# Patient Record
Sex: Female | Born: 1940 | Race: Black or African American | Hispanic: No | Smoking: Former smoker
Health system: Southern US, Community
[De-identification: ages and names within clinical notes are randomized; demographics above are authoritative.]

## PROBLEM LIST (undated history)

## (undated) DIAGNOSIS — E039 Hypothyroidism, unspecified: Secondary | ICD-10-CM

## (undated) DIAGNOSIS — E119 Type 2 diabetes mellitus without complications: Secondary | ICD-10-CM

## (undated) DIAGNOSIS — I10 Essential (primary) hypertension: Secondary | ICD-10-CM

## (undated) DIAGNOSIS — N189 Chronic kidney disease, unspecified: Secondary | ICD-10-CM

## (undated) DIAGNOSIS — D649 Anemia, unspecified: Secondary | ICD-10-CM

## (undated) DIAGNOSIS — K219 Gastro-esophageal reflux disease without esophagitis: Secondary | ICD-10-CM

## (undated) DIAGNOSIS — I639 Cerebral infarction, unspecified: Secondary | ICD-10-CM

## (undated) HISTORY — PX: BACK SURGERY: SHX140

## (undated) HISTORY — PX: CHOLECYSTECTOMY: SHX55

## (undated) HISTORY — PX: BREAST SURGERY: SHX581

## (undated) HISTORY — PX: VASCULAR SURGERY: SHX849

## (undated) HISTORY — PX: APPENDECTOMY: SHX54

## (undated) HISTORY — PX: KNEE ARTHROSCOPY WITH PATELLAR TENDON REPAIR: SHX5656

---

## 2013-11-28 ENCOUNTER — Ambulatory Visit: Payer: Self-pay | Admitting: Vascular Surgery

## 2013-11-28 LAB — CBC WITH DIFFERENTIAL/PLATELET
Basophil #: 0.1 10*3/uL (ref 0.0–0.1)
Basophil %: 1.3 %
EOS ABS: 0.5 10*3/uL (ref 0.0–0.7)
Eosinophil %: 6.5 %
HCT: 36.6 % (ref 35.0–47.0)
HGB: 11.7 g/dL — ABNORMAL LOW (ref 12.0–16.0)
LYMPHS ABS: 1.7 10*3/uL (ref 1.0–3.6)
Lymphocyte %: 22.1 %
MCH: 35.7 pg — ABNORMAL HIGH (ref 26.0–34.0)
MCHC: 32 g/dL (ref 32.0–36.0)
MCV: 112 fL — AB (ref 80–100)
MONOS PCT: 9.1 %
Monocyte #: 0.7 x10 3/mm (ref 0.2–0.9)
NEUTROS ABS: 4.7 10*3/uL (ref 1.4–6.5)
Neutrophil %: 61 %
PLATELETS: 94 10*3/uL — AB (ref 150–440)
RBC: 3.28 10*6/uL — ABNORMAL LOW (ref 3.80–5.20)
RDW: 15 % — ABNORMAL HIGH (ref 11.5–14.5)
WBC: 7.7 10*3/uL (ref 3.6–11.0)

## 2013-11-28 LAB — BASIC METABOLIC PANEL
Anion Gap: 11 (ref 7–16)
BUN: 62 mg/dL — ABNORMAL HIGH (ref 7–18)
CHLORIDE: 105 mmol/L (ref 98–107)
Calcium, Total: 8.7 mg/dL (ref 8.5–10.1)
Co2: 25 mmol/L (ref 21–32)
Creatinine: 8.95 mg/dL — ABNORMAL HIGH (ref 0.60–1.30)
EGFR (African American): 5 — ABNORMAL LOW
EGFR (Non-African Amer.): 4 — ABNORMAL LOW
Glucose: 101 mg/dL — ABNORMAL HIGH (ref 65–99)
OSMOLALITY: 299 (ref 275–301)
Potassium: 4.6 mmol/L (ref 3.5–5.1)
Sodium: 141 mmol/L (ref 136–145)

## 2014-01-22 ENCOUNTER — Ambulatory Visit: Payer: Self-pay | Admitting: Vascular Surgery

## 2014-01-22 LAB — BASIC METABOLIC PANEL
ANION GAP: 6 — AB (ref 7–16)
BUN: 42 mg/dL — AB (ref 7–18)
CHLORIDE: 103 mmol/L (ref 98–107)
CREATININE: 5.15 mg/dL — AB (ref 0.60–1.30)
Calcium, Total: 9.2 mg/dL (ref 8.5–10.1)
Co2: 31 mmol/L (ref 21–32)
EGFR (African American): 9 — ABNORMAL LOW
GFR CALC NON AF AMER: 8 — AB
Glucose: 97 mg/dL (ref 65–99)
Osmolality: 290 (ref 275–301)
POTASSIUM: 4.5 mmol/L (ref 3.5–5.1)
SODIUM: 140 mmol/L (ref 136–145)

## 2014-02-26 ENCOUNTER — Ambulatory Visit: Payer: Self-pay | Admitting: Vascular Surgery

## 2014-02-26 LAB — CBC
HCT: 36.7 % (ref 35.0–47.0)
HGB: 11.5 g/dL — AB (ref 12.0–16.0)
MCH: 35 pg — ABNORMAL HIGH (ref 26.0–34.0)
MCHC: 31.3 g/dL — ABNORMAL LOW (ref 32.0–36.0)
MCV: 112 fL — ABNORMAL HIGH (ref 80–100)
PLATELETS: 113 10*3/uL — AB (ref 150–440)
RBC: 3.29 10*6/uL — AB (ref 3.80–5.20)
RDW: 18.6 % — AB (ref 11.5–14.5)
WBC: 9.6 10*3/uL (ref 3.6–11.0)

## 2014-02-26 LAB — BASIC METABOLIC PANEL
Anion Gap: 10 (ref 7–16)
BUN: 51 mg/dL — ABNORMAL HIGH (ref 7–18)
CREATININE: 6.67 mg/dL — AB (ref 0.60–1.30)
Calcium, Total: 9.3 mg/dL (ref 8.5–10.1)
Chloride: 101 mmol/L (ref 98–107)
Co2: 28 mmol/L (ref 21–32)
EGFR (African American): 7 — ABNORMAL LOW
EGFR (Non-African Amer.): 6 — ABNORMAL LOW
GLUCOSE: 123 mg/dL — AB (ref 65–99)
OSMOLALITY: 293 (ref 275–301)
Potassium: 5.1 mmol/L (ref 3.5–5.1)
SODIUM: 139 mmol/L (ref 136–145)

## 2014-02-26 LAB — APTT: ACTIVATED PTT: 28.1 s (ref 23.6–35.9)

## 2014-02-26 LAB — PROTIME-INR
INR: 1.1
Prothrombin Time: 13.8 secs (ref 11.5–14.7)

## 2014-03-01 ENCOUNTER — Ambulatory Visit: Payer: Self-pay | Admitting: Vascular Surgery

## 2014-03-26 ENCOUNTER — Ambulatory Visit: Payer: Self-pay | Admitting: Vascular Surgery

## 2014-04-12 ENCOUNTER — Ambulatory Visit: Payer: Self-pay | Admitting: Vascular Surgery

## 2014-04-12 LAB — BASIC METABOLIC PANEL
Anion Gap: 10 (ref 7–16)
BUN: 52 mg/dL — ABNORMAL HIGH (ref 7–18)
CALCIUM: 9.2 mg/dL (ref 8.5–10.1)
Chloride: 100 mmol/L (ref 98–107)
Co2: 28 mmol/L (ref 21–32)
Creatinine: 7.33 mg/dL — ABNORMAL HIGH (ref 0.60–1.30)
EGFR (African American): 7 — ABNORMAL LOW
EGFR (Non-African Amer.): 6 — ABNORMAL LOW
Glucose: 89 mg/dL (ref 65–99)
OSMOLALITY: 289 (ref 275–301)
POTASSIUM: 5 mmol/L (ref 3.5–5.1)
Sodium: 138 mmol/L (ref 136–145)

## 2014-04-12 LAB — CBC
HCT: 38.4 % (ref 35.0–47.0)
HGB: 11.9 g/dL — AB (ref 12.0–16.0)
MCH: 33.5 pg (ref 26.0–34.0)
MCHC: 31 g/dL — ABNORMAL LOW (ref 32.0–36.0)
MCV: 108 fL — ABNORMAL HIGH (ref 80–100)
PLATELETS: 124 10*3/uL — AB (ref 150–440)
RBC: 3.55 10*6/uL — ABNORMAL LOW (ref 3.80–5.20)
RDW: 15.3 % — ABNORMAL HIGH (ref 11.5–14.5)
WBC: 7.2 10*3/uL (ref 3.6–11.0)

## 2014-04-12 LAB — PROTIME-INR
INR: 1
Prothrombin Time: 13.1 secs (ref 11.5–14.7)

## 2014-04-13 LAB — PHOSPHORUS: Phosphorus: 4 mg/dL (ref 2.5–4.9)

## 2014-08-18 ENCOUNTER — Observation Stay: Payer: Self-pay | Admitting: Vascular Surgery

## 2014-08-18 LAB — CBC WITH DIFFERENTIAL/PLATELET
Basophil #: 0.1 10*3/uL (ref 0.0–0.1)
Basophil %: 0.9 %
Eosinophil #: 0.2 10*3/uL (ref 0.0–0.7)
Eosinophil %: 2.6 %
HCT: 29.4 % — AB (ref 35.0–47.0)
HGB: 9.4 g/dL — ABNORMAL LOW (ref 12.0–16.0)
Lymphocyte #: 2.1 10*3/uL (ref 1.0–3.6)
Lymphocyte %: 22.3 %
MCH: 34.5 pg — ABNORMAL HIGH (ref 26.0–34.0)
MCHC: 32.2 g/dL (ref 32.0–36.0)
MCV: 107 fL — ABNORMAL HIGH (ref 80–100)
Monocyte #: 1 x10 3/mm — ABNORMAL HIGH (ref 0.2–0.9)
Monocyte %: 10.4 %
Neutrophil #: 6 10*3/uL (ref 1.4–6.5)
Neutrophil %: 63.8 %
Platelet: 105 10*3/uL — ABNORMAL LOW (ref 150–440)
RBC: 2.74 10*6/uL — ABNORMAL LOW (ref 3.80–5.20)
RDW: 17.4 % — AB (ref 11.5–14.5)
WBC: 9.4 10*3/uL (ref 3.6–11.0)

## 2014-08-18 LAB — BASIC METABOLIC PANEL
Anion Gap: 13 (ref 7–16)
BUN: 53 mg/dL — ABNORMAL HIGH (ref 7–18)
CALCIUM: 7.9 mg/dL — AB (ref 8.5–10.1)
CREATININE: 7.07 mg/dL — AB (ref 0.60–1.30)
Chloride: 105 mmol/L (ref 98–107)
Co2: 25 mmol/L (ref 21–32)
EGFR (Non-African Amer.): 6 — ABNORMAL LOW
GFR CALC AF AMER: 7 — AB
Glucose: 79 mg/dL (ref 65–99)
Osmolality: 298 (ref 275–301)
POTASSIUM: 5.4 mmol/L — AB (ref 3.5–5.1)
SODIUM: 143 mmol/L (ref 136–145)

## 2014-08-19 LAB — CBC WITH DIFFERENTIAL/PLATELET
BASOS PCT: 0.7 %
Basophil #: 0.1 10*3/uL (ref 0.0–0.1)
EOS ABS: 0.3 10*3/uL (ref 0.0–0.7)
Eosinophil %: 4.1 %
HCT: 25.4 % — AB (ref 35.0–47.0)
HGB: 8 g/dL — AB (ref 12.0–16.0)
LYMPHS PCT: 26.2 %
Lymphocyte #: 1.9 10*3/uL (ref 1.0–3.6)
MCH: 33.4 pg (ref 26.0–34.0)
MCHC: 31.5 g/dL — AB (ref 32.0–36.0)
MCV: 106 fL — ABNORMAL HIGH (ref 80–100)
MONO ABS: 0.8 x10 3/mm (ref 0.2–0.9)
Monocyte %: 10.7 %
Neutrophil #: 4.2 10*3/uL (ref 1.4–6.5)
Neutrophil %: 58.3 %
Platelet: 103 10*3/uL — ABNORMAL LOW (ref 150–440)
RBC: 2.4 10*6/uL — ABNORMAL LOW (ref 3.80–5.20)
RDW: 17 % — ABNORMAL HIGH (ref 11.5–14.5)
WBC: 7.2 10*3/uL (ref 3.6–11.0)

## 2014-08-19 LAB — BASIC METABOLIC PANEL
Anion Gap: 9 (ref 7–16)
BUN: 72 mg/dL — AB (ref 7–18)
Calcium, Total: 7.6 mg/dL — ABNORMAL LOW (ref 8.5–10.1)
Chloride: 100 mmol/L (ref 98–107)
Co2: 32 mmol/L (ref 21–32)
Creatinine: 7.86 mg/dL — ABNORMAL HIGH (ref 0.60–1.30)
EGFR (African American): 6 — ABNORMAL LOW
EGFR (Non-African Amer.): 5 — ABNORMAL LOW
Glucose: 84 mg/dL (ref 65–99)
Osmolality: 302 (ref 275–301)
POTASSIUM: 5.1 mmol/L (ref 3.5–5.1)
SODIUM: 141 mmol/L (ref 136–145)

## 2014-08-19 LAB — PHOSPHORUS: Phosphorus: 6.1 mg/dL — ABNORMAL HIGH (ref 2.5–4.9)

## 2014-09-03 ENCOUNTER — Ambulatory Visit: Payer: Self-pay | Admitting: Vascular Surgery

## 2014-09-05 ENCOUNTER — Ambulatory Visit: Payer: Self-pay | Admitting: Vascular Surgery

## 2014-11-02 NOTE — Op Note (Signed)
PATIENT NAME:  [name], [name] MR#:  [id number] DATE OF BIRTH:  July 11, 1941  DATE OF PROCEDURE:  11/28/2013  PREOPERATIVE DIAGNOSES: 1.  Complication of dialysis device, with poor flows and inadequate function of right femoral tunneled dialysis catheter.  2.  End-stage renal disease, requiring hemodialysis.  3.  Hypertension.   POSTOPERATIVE DIAGNOSES: 1.  Complication of dialysis device, with poor flows and inadequate function of right femoral tunneled dialysis catheter.  2.  End-stage renal disease, requiring hemodialysis.  3.  Hypertension.   PROCEDURE PERFORMED:  1. Insertion of right IJ cuffed tunneled dialysis catheter with ultrasound and fluoroscopic guidance.  2.  Removal of right femoral cuff tunneled dialysis catheter.  3.  Placement of a micro sheath left arm with ultrasound guidance.  4.  Central venography with introduction catheter into superior vena cava, left arm approach.   PROCEDURE PERFORMED BY: Levora Dredge, M.D.   SEDATION: Versed 4 mg plus fentanyl 150 mcg administered IV. Continuous ECG, pulse oximetry, and cardiopulmonary monitoring is performed throughout the entire procedure by the interventional radiology nurse. Total sedation time is 45 minutes.   ACCESS:  1. Right internal jugular vein for the cuff tunneled catheter.  2. Left brachial vein for the intravenous micro sheath.   CONTRAST USED: Isovue 15 mL.   FLUOROSCOPY TIME: 0.6 minutes.   INDICATIONS: Ms. Swallows is a 74 year old woman who has been having increasing problems with her dialysis access. She is newly on dialysis, and is now here for placement of adequate access and planning for future upper extremity access. Risks and benefits were reviewed. Plan is reviewed. All questions are answered. The patient has agreed to proceed.   DESCRIPTION OF PROCEDURE: The patient is taken to special procedures and placed in the supine position. After adequate sedation is achieved, the right neck and chest wall  are prepped and draped in sterile fashion. Appropriate timeout is called.   Ultrasound is then utilized, it is placed in a sterile sleeve. Jugular vein is identified. It is echolucent and compressible, indicating patency. Image is recorded for the permanent record. Then 1% lidocaine is infiltrated in the soft tissues at the base of the neck as well as the right chest wall, and a micropuncture needle is inserted into the jugular vein, microwire followed by micro-sheath. Fluoroscopy is used to verify proper wire placement and the micro-sheath is inserted. J-wire is then advanced through the micro-sheath under fluoroscopic guidance, and positioned with its tip in the inferior vena cava. A small counter-incision is created at the wire insertion site with a #11 blade scalpel, and dilators passed over the wire dilator, and peel-away sheath is inserted. Dilator is removed, and a 19 cm tip-to-cuff Cannon catheter is advanced over the wire through the peel-away sheath. Under fluoroscopy, tips are verified to be in the proper location, and the wire and subsequently peel-away sheath are removed.   Under fluoroscopy, the tips are then positioned appropriately at the atriocaval junction, and the catheter is approximated to the chest wall. Exit site is selected. A small incision is made, and the tunneling device is passed subcutaneously. A tunnel is created with the dilator, and subsequently the catheter is pulled subcutaneously through the tunnel. Under fluoroscopy, the tips are then positioned properly. The catheter is transected, the hub is connected. Both lumens aspirate and flush easily, and are packed with 5000 units of heparin per lumen. The catheter is secured to the chest wall with 0 silk. The counterincision is closed with a 4-0 Monocryl subcuticular and then  a Dermabond. Sterile dressing is applied.   Attention is then turned to the right thigh, where a small incision is made at the exit site, and the cuff is  subsequently freed from the surrounding tissues, and the catheter is removed. Light pressure is held, and ointment is placed at the exit site. Sterile dressing is applied.   Ultrasound is then utilized to image the brachial veins of the left arm. They are echolucent and compressible, indicating patency. Image is recorded, and a micropuncture needle is inserted, microwire is inserted, followed by a micro-sheath. Hand injection of contrast is then used to demonstrate the venous pattern of the upper extremity. However, imaging of the central veins is inadequate, particularly at the level of the innominate, which is concerning, as the patient has had one left IJ tunneled catheter in the past, and this could represent a stenotic lesion. Therefore, a floppy glidewire is introduced through the micro-sheath and advanced into the right atrium. Subsequently, the micro-sheath is removed, and a Kumpe catheter is advanced over the wire and positioned first in the subclavian and then more centrally in the innominate superior vena cava. Imaging demonstrates that the central veins are widely patent. Initial imaging of the arm demonstrates the brachial veins and the axillary vein are widely patent. Therefore, the patient has adequate anatomy for a brachial axillary dialysis graft. The wire is reintroduced into the Kumpe catheter, and the micro-sheath is reintroduced, and this will serve as her IV while she recovers, and will be removed prior to her discharge.   INTERPRETATION: As noted above, the patient has widely patent central venous system from a left arm approach, and is an adequate candidate for brachial axillary dialysis graft. Right jugular and innominate veins appear patent.     ____________________________ Renford DillsGregory G. Suhan Paci, MD ggs:mr D: 11/28/2013 19:14:00 ET T: 11/28/2013 20:37:30 ET JOB#: [id number]  cc: Renford DillsGregory G. Sun Wilensky, MD, <Dictator> Renford DillsGREGORY G Lemoine Goyne MD ELECTRONICALLY SIGNED 12/11/2013 12:28

## 2014-11-02 NOTE — Op Note (Signed)
PATIENT NAME:  [name], [name] MR#:  [id number] DATE OF BIRTH:  04-07-1941  DATE OF PROCEDURE:  04/12/2014  PREOPERATIVE DIAGNOSES:  1.  Ischemia, left hand.  2.  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  3.  End-stage renal disease requiring hemodialysis.   POSTOPERATIVE DIAGNOSES: 1.  Ischemia, left hand.  2.  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  3.  End-stage renal disease requiring hemodialysis.   PROCEDURE PERFORMED:  Left proximal brachial to distal brachial artery bypass using reversed great saphenous vein from the left leg for a DRIL procedure (distal revascularization and interval ligation).   PROCEDURE PERFORMED BY: Renford Dills, MD  ANESTHESIA: General by endotracheal intubation.   FLUIDS: Per anesthesia record.   ESTIMATED BLOOD LOSS: 100 mL   SPECIMEN: None.   INDICATIONS: Ms. Loeza is a 74 year old woman who presents with a functioning access on her left arm, but with profound pain in her fingers and hand, and physical examination as well as noninvasive studies, consistent with steal syndrome. Risks and benefits for distal revascularization with interval ligation was reviewed. All questions were answered. The patient has agreed to proceed.   DESCRIPTION OF PROCEDURE: The patient is taken to the operating room and placed in the supine position. After adequate general anesthesia was induced,  appropriate invasive monitors were placed, she was positioned supine with her left arm extended palm upward. She was also flexed at her hip and knee. Her left thigh and her left arm were then prepped and draped in a circumferential fashion.   A linear incision is created overlying the greater saphenous vein, which had been mapped preoperatively with ultrasound. Dissection was carried down through the soft tissues, and the vein was identified. It was skeletonized from the saphenofemoral junction down to 1 handbreadth above the  knee. The wound was then packed with a saline moistened laparotomy pad.   Beginning at the level of the antecubital fossa, a linear incision was created and the dissection was carried down to expose the distalmost brachial artery and the bifurcation of the radial and ulnar arteries. Radial and ulnar arteries were looped proximally and with Silastic vessel loops, and then the brachial artery was looped more proximally with a red Silastic vessel loop.   The proximal brachial artery/axillary artery was then isolated through a linear incision. A tunnel was then created with a DeBakey vascular clamp.   After measuring to ensure adequate length of saphenous vein, the saphenous vein was ligated and divided with 2-0 silk suture. It was then flushed with heparinized saline. The vein was then reversed. Arteriotomy was made in the proximal brachial, extended with Potts scissors. The vein was spatulated and an end-vein-to-side-brachial-artery anastomosis fashioned with running 6-0 Prolene. Flushing maneuvers were performed and the vein was pressurized. It was then pulled through the subcutaneous tunnel using the DeBakey clamp. It was approximated to the distal brachial artery. The brachial artery was controlled with Silastic vessel loops. Arteriotomy was made and extended with Potts scissors. The vein was then spatulated at the appropriate length, and an end-vein-to-side-distal-brachial-artery anastomosis was fashioned with running 6-0 Prolene. Flushing maneuvers were performed and flow was established distally. Working just proximal to the anastomosis, 0 Ethibond was used to ligate the brachial artery.   All 3 wounds were then irrigated. Evicel and Surgicel were placed around the suture lines, and subsequently the wounds were closed using multiple layers of 2-0 and 3-0 Vicryl, followed by  4-0 Monocryl subcuticular. Dermabond was applied to  all 3 lumens.   The patient tolerated the procedure well and there were no  immediate complications. Sponge and needle counts were correct, and she was taken to the recovery area in stable condition.    ____________________________ Renford DillsGregory G. Schnier, MD ggs:MT D: 04/12/2014 17:04:00 ET T: 04/13/2014 06:29:48 ET JOB#: [id number]  cc: Renford DillsGregory G. Schnier, MD, <Dictator> Renford DillsGREGORY G SCHNIER MD ELECTRONICALLY SIGNED 04/29/2014 20:45

## 2014-11-02 NOTE — Op Note (Signed)
PATIENT NAME:  [name], Lilyauna MR#:  [id number] DATE OF BIRTH:  25-Apr-1941  DATE OF PROCEDURE:  01/22/2014  PREOPERATIVE DIAGNOSES:  1.  End-stage renal disease requiring hemodialysis.  2.  Atherosclerosis bilateral upper extremities.   POSTOPERATIVE DIAGNOSES:  1.  End-stage renal disease requiring hemodialysis.  2.  Atherosclerosis bilateral upper extremities.   PROCEDURES PERFORMED:   1.  Arch aortogram.  2.  Left upper extremity distal runoff, 3rd order catheter placement.   SURGEON: Renford DillsGregory G. Schnier, M.D.   SEDATION:  Versed 3 mg plus fentanyl 100 mcg administered IV. Continuous ECG, pulse oximetry and cardiopulmonary monitoring is performed throughout the entire procedure by the interventional radiology nurse. Total sedation time is 1 hour.   ACCESS: A 5 French sheath, right common femoral artery.   FLUOROSCOPY TIME: 4.7 minutes.   CONTRAST USED: Isovue 50 mL.   INDICATIONS: Ms. [name] is a 74 year old woman who has now been initiated on hemodialysis and requires permanent upper extremity access. She has atherosclerotic changes of her upper extremities bilaterally, based on physical examination as well as noninvasive studies. She is therefore undergoing arteriography to plan optimal access from the arterial standpoint and possible intervention to secure a better situation for permanent dialysis access. Risks and benefits were reviewed. All questions answered. The patient agrees to proceed.   DESCRIPTION OF PROCEDURE: The patient is taken to special procedures and placed in the supine position. After adequate sedation is achieved, both groins are prepped and draped in sterile fashion. Appropriate timeout is called.   Ultrasound is placed in a sterile sleeve. Ultrasound is utilized to visualize the common femoral artery on the right. Femoral bifurcation is noted. Posterior plaque is noted, particularly at the level of the bifurcation in the distal common femoral, and the common  femoral is scanned more proximally to a place above the visualized plaque. Access is obtained under direct visualization after images recorded. Using a Micropuncture needle, Microwire followed by a Microsheath, J-wire followed by a 5 French sheath and a pigtail catheter. Pigtail catheter is advanced into the ascending aorta and an LAO projection of the arch is obtained.   An H1 catheter is then utilized with a stiff angled Glidewire to access the left subclavian and imaging of the subclavian is obtained. The catheter is then advanced further to the level of the axillary and then subsequently a straight 150 slip catheter is exchanged to advance down to the trifurcation at the level of the antecubital fossa. Imaging is obtained through the catheter by hand injection. Arch aortogram was bolus injection.   After review of the images, the catheter is removed over the wire. Oblique view of the groin is obtained, and a Mynx device is deployed. There are no immediate complications.   INTERPRETATION: The thoracic aortic arch is opacified with a bolus injection of contrast. There are diffuse calcifications. There is moderate stenosis is noted at all 3 ostia of the great vessels with the left carotid being the worst and perhaps achieving hemodynamic significance. There does appear to be approximately 70% to 75% diameter reduction.   However, the subclavian the artery of greatest interest and this appears to have approximately a 40% fairly smooth narrowing. It does not appear to be hemodynamically significant as the subclavian itself appears to be to 9-10 mm in diameter throughout the majority of its course. There is no proximal stenosis within the vertebral system and the LAD is widely patent. Subclavian is otherwise patent as are the axillary and brachial arteries.  Distally the trifurcation is patent and the ulnar is the dominant artery to the hand.  It is diffusely diseased but does not appear to have any  hemodynamically significant stenoses. It does fill the palmar arch but only the 5th, 4th, and 3rd fingers fill. There does appear to be some filling of the 2nd but there is absolutely no filling of the thumb. The radial artery is heavily diseased in its proximal half, and occludes in its distal half and does contribute at all to the hand. Interosseous is quite diseased and is not contributing significant flow from the mid forearm distally.   SUMMARY: A very distal difficult situation. The lesion in the subclavian does not appear to achieve hemodynamic significance and is not treated at this time. Of note, there is very delayed filling. It takes approximately 12 seconds for contrast injected at the axillary level to reach the hand and this appears to be based primarily on a high resistance, a very heavy distal small vessel disease. This will not improve, and will only be made worse with any kind of fistula or graft placement. I have discussed this with the patient preliminarily. I think preparations for possible DRIL are needed and I have discussed with the patient that it may end up that going through creation of an upper extremity access will only result in ligation of the access to allow for pain-free existence of her hand. However, I have also discussed the problems associated with catheters and the very high rate of catheter-related sepsis and early death from catheter-dependent dialysis as a motivation to proceed with attempted upper extremity access. The patient acknowledges these. She will return to the office with vein mapping for possible DRIL and preparations for a brachial axillary graft will be undertaken   ____________________________ Renford Dills, MD ggs:lt D: 01/23/2014 07:42:06 ET T: 01/23/2014 08:25:01 ET JOB#: [id number]  cc: Renford Dills, MD, <Dictator> Renford Dills MD ELECTRONICALLY SIGNED 02/05/2014 12:22

## 2014-11-02 NOTE — Op Note (Signed)
PATIENT NAME:  [name], [name] MR#:  [id number] DATE OF BIRTH:  1940-08-13  DATE OF PROCEDURE:  03/26/2014  PREOPERATIVE DIAGNOSES:  1.  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  2.  Complication of vascular device with steal syndrome.  3.  End-stage renal disease requiring hemodialysis.   POSTOPERATIVE DIAGNOSES:  1.  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  2.  Complication of vascular device with steal syndrome.  3.  End-stage renal disease requiring hemodialysis.   PROCEDURE PERFORMED:   1.  Arch aortogram.  2.  Selective injection of the left subclavian and axillary arteries.   SURGEON: Renford Dills, MD.   SEDATION: Versed 4 mg plus fentanyl 100 mcg administered IV. Continuous ECG, pulse oximetry, and cardiopulmonary monitoring is performed throughout the entire procedure by the interventional radiology nurse. Total sedation time was 1 hour.   ACCESS: A 5 French sheath, right common femoral artery.   FLUOROSCOPY TIME: 7.9 minutes.   CONTRAST USED: Isovue 50 mL.   INDICATIONS: [name] is a 74 year old woman who recently underwent creation of a left arm brachial axillary dialysis graft. She has developed profound hand pain and physical examination as well as noninvasive studies demonstrates steal syndrome. She is now undergoing angiography to determine whether the ostial subclavian lesion needs to be treated. Risks and benefits are reviewed. All questions answered. The patient agrees to proceed.   DESCRIPTION OF PROCEDURE: The patient is taken to the Special Procedure Suite, placed in the supine position. After adequate sedation is achieved, both groins are prepped and draped in a sterile fashion. Appropriate timeout is called.   The common femoral artery on the right is identified. It is echolucent and pulsatile indicating patency. Image is recorded for the permanent record. Distally, there is significant plaque in the  posterior aspect, so the ultrasound is used to scan more proximally where there is a significant reduction in the plaque burden and an adequate site for access. Micropuncture needle is then inserted under direct ultrasound visualization. Microwire followed by micro sheath, J-wire followed by a 5 French sheath and 5 French pigtail catheter. The pigtail catheter is then advanced up to the ascending aorta and LAO projection of the arch is obtained. Using several different catheters, but ultimately a KMP catheter, the subclavian is engaged. The catheter is advanced out into the subclavian where the distal subclavian, axillary, and proximal brachial arteries are visualized. The wire is then advanced, and a 6 [place] Raabe sheath is exchanged for the 5 [place] sheath. With the Raabe sheath positioned right at the ostia and using full-strength contrast, multiple images of the origin of the subclavian are obtained including AP, LAO, and RAO. After review of these images, the sheath is pulled back into the external iliac. Oblique view is obtained and a StarClose device deployed. There were no immediate complications.   INTERPRETATION: The arch demonstrates normal anatomy. There are diffuse calcifications on the arch view. There does not appear to be any hemodynamically significant stenoses of the ostia of the great vessels.   The subclavian, axillary, and proximal brachial arteries are widely patent. At the ostia of the subclavian on the magnified views there is a 30% narrowing secondary to plaque formation; however, this does not achieve hemodynamic significance and therefore no interventions are performed.   SUMMARY: Mild plaque formation at the ostia of the subclavian that is not responsible for any diminished flow. We will proceed with plans for a DRIL procedure to revascularize the patient's  hand and salvage the dialysis access.     ____________________________ Renford DillsGregory G. Camauri Fleece, MD ggs:at D: 03/27/2014  09:15:09 ET T: 03/27/2014 09:27:48 ET JOB#: [id number]  cc: Renford DillsGregory G. Nisreen Guise, MD, <Dictator> Mosetta PigeonHarmeet Singh, MD Renford DillsGREGORY G Kaliya Shreiner MD ELECTRONICALLY SIGNED 04/29/2014 20:42

## 2014-11-02 NOTE — Op Note (Signed)
PATIENT NAME:  [name], [name] MR#:  [id number] DATE OF BIRTH:  1940-09-02  DATE OF PROCEDURE:  03/01/2014  PREOPERATIVE DIAGNOSIS: End-stage renal disease requiring hemodialysis.   POSTOPERATIVE DIAGNOSIS: End-stage renal disease requiring hemodialysis.   PROCEDURE PERFORMED: Creation of a left arm brachial axillary dialysis graft.   SURGEON: Renford DillsGregory G. Ramiya Delahunty, MD.   ANESTHESIA: General by LMA.   FLUIDS: Per anesthesia record.   ESTIMATED BLOOD LOSS: 75 mL.   SPECIMEN: None.   INDICATIONS: [name] is a 74 year old woman who has recently been initiated on dialysis. She is therefore undergoing creation of an upper extremity access. Risks and benefits are reviewed. All questions are answered. The patient agrees to proceed.   DESCRIPTION OF PROCEDURE: The patient is taken to the operating room and placed in the supine position. After adequate general anesthesia is induced and appropriate invasive monitors are placed, she is positioned supine with her left arm extended palm upward. The left arm is prepped and draped in a sterile fashion. Appropriate timeout is called.   A linear incision is then created along the brachial impulse, just above the antecubital crease and the dissection is carried down to expose the brachial artery which is looped proximally and distally with Silastic vessel loops.   In a similar fashion, linear incision is made along the anterior axillary line and the dissection is carried down to expose the axillary vein which is looped proximally and distally. It should be noted that 0.25% Sensorcaine without epinephrine was infiltrated into the skin and soft tissues prior to both incisions.   GORE tunneling device is then used to create a subcutaneous path and a 4-7 mm tapered internally reinforced Propaten graft is pulled through the subcutaneous tunnel.   The 4 mm segment is beveled slightly.  Arteriotomy is made in the brachial artery and an end graft to side  brachial artery anastomosis is fashioned with running CV-6 suture. The 6-0 Prolene stay sutures were placed prior to creating the anastomosis.   Flushing maneuvers are performed and flow was re-established to the hand.   The graft is irrigated with heparinized saline and then clamped just above the arterial suture line.   The graft is then approximated to the axillary vein marked with a surgical marker. Venotomy is made and then the graft is beveled to match the venotomy. End graft to side axillary vein anastomosis is then fashioned using running CV-6 suture. Again, 6-0 Prolene stay sutures were placed.   Flushing maneuvers are performed and flow is established through the graft.  Soft thrill is easily appreciated, 1+ radial pulse is noted.   Both wounds are then irrigated with sterile saline and closed in layers using running 3-0 Vicryl, followed by 4-0 Monocryl subcuticular and then Dermabond. The patient tolerated the procedure well and there were no immediate complications.    ____________________________ Renford DillsGregory G. Everli Rother, MD ggs:DT D: 03/01/2014 13:42:43 ET T: 03/01/2014 15:43:58 ET JOB#: [id number]  cc: Renford DillsGregory G. Tyjanae Bartek, MD, <Dictator> Salomon MastBelayenh Befekadu, MD Renford DillsGREGORY G Toren Tucholski MD ELECTRONICALLY SIGNED 03/19/2014 22:35

## 2014-11-05 ENCOUNTER — Ambulatory Visit
Admit: 2014-11-05 | Disposition: A | Discharge status: Home / Self Care | Payer: Self-pay | Attending: Vascular Surgery | Admitting: Vascular Surgery

## 2014-11-10 NOTE — H&P (Signed)
PATIENT NAME:  [name], Haneen C MR#:  [id number] DATE OF BIRTH:  [birth year]  DATE OF ADMISSION:  08/18/2014  PRIMARY CARE PHYSICIAN: The patient states that she currently does not have one.   PRIMARY VASCULAR SURGEON: Dr. Gilda CreaseSchnier.   CHIEF COMPLAINT: Left upper extremity hematoma.   HISTORY OF PRESENT ILLNESS: This is a 74 year old female with significant past medical history, most especially significant for end-stage renal disease, on hemodialysis, who presents 2 days after the development of a hematoma in her left upper extremity. This was due to incorrect access of her AV graft per the patient's report. She states that she was at hemodialysis last Friday, and the nurse accessed her graft transversely perpendicular to the graft itself rather than parallel to the length of the graft, causing trauma to it and a subsequent hematoma. She states that she was told by the dialysis staff that it would be all right. After she noticed swelling at dialysis, they recommended that she put ice on it and take aspirin. One day later she was taken by a friend to [facility] as the swelling had increased, and there was also some associated pain. The patient was diagnosed with what was likely a hemat[facility], and the vascular surgeon on call for Dr. Gilda CreaseSchnier, was consulted. He recommended that the patient be admitted here to [facility] as a direct admit to the hospitalist service in order to have this AV graft evaluated by the vascular surgery team here. Of note, the patient was able to complete her hemodialysis session on Friday. She dialyzes on Mondays, Wednesdays, and Fridays. Normally, she dialyzes with [facility] in [place], [place]. The patient is being admitted here to the hospitalist service with a vascular surgery consult in place.   PAST MEDICAL HISTORY: End-stage renal disease on hemodialysis, hypertension, type 2 diabetes mellitus, GERD, hypothyroidism.    MEDICATIONS: Levothyroxine 50 mcg daily, Aggrenox 25 mg, Dexilant 60 mg daily, carvedilol 25 mg 2 times a day, lisinopril 20 mg 2 times a day, pravastatin 40 mg daily, Levaquin 2 to 4 units subcutaneous once a day at bedtime, duloxetine 30 mg daily, aspirin 81 mg 2 tablets once a day, diltiazem 180 mg 24 hour extended release 1 tablet daily, vitamin D2 at 50,000 international units 1 capsule weekly, Renvela carbonate 800 mg 2 tablets 3 times a day with meals, oxycodone 10 mg 1 tablet every 8 hours as needed for pain, pantoprazole 40 mg 1 tablet daily, Rena-Vite vitamin B complex with C and folic acid 1 tablet daily, OxyContin 20 mg 1 tablet every 12 hours as needed for pain.   SURGICAL HISTORY: Breast reduction, cholecystectomy, appendectomy, cataract removal, 3 prior exploratory laparotomies, right patellar replacement, AV graft placement, and 9 back surgeries, initially diskectomies followed by subsequent multiple repair surgeries.   ALLERGIES: PENICILLIN, SULFA, NSAIDS,  CANNABINOIDS.   FAMILY HISTORY: Father died of a duodenal ulcer. Mother died of severe asthma. Multiple brothers who had diabetes. Also, she had a brother with cancer, another brother with end-stage renal disease. Multiple sisters with medical conditions including cancer, stroke, aneurysm.   SOCIAL HISTORY: The patient is an ex-smoker and quit smoking 5 years ago.  She smoked 1 pack per week for about 50 years. She denies any alcohol or other illicit drug use.   REVIEW OF SYSTEMS:  CONSTITUTIONAL: Denies fever, fatigue and weakness.  EYES: Denies blurred or double vision, redness and inflammation.  ENT: Denies ear pain, hearing loss or discharge.  RESPIRATORY:  Denies cough, wheeze and dyspnea.  CARDIOVASCULAR: Denies chest pain, orthopnea, edema.  GASTROINTESTINAL: Denies nausea, vomiting, diarrhea, abdominal pain and GERD.  GENITOURINARY: Denies dysuria, frequency and incontinence.  HEMATOLOGIC/LYMPHATIC: Endorses bleeding  in her left upper extremity. Denies regular easy bruising and swollen glands.  INTEGUMENTARY: Denies acne, rash or other lesions.  MUSCULOSKELETAL: Denies current pain. Denies joint swelling and redness.  NEUROLOGIC: Denies numbness, weakness or headache.  PSYCHIATRIC: Denies anxiety, insomnia, depression.   PHYSICAL EXAMINATION:  VITAL SIGNS: Blood pressure 164/76, pulse 69, temperature 98.1, respirations 18, oxygen saturation 99% on room air.  GENERAL: This is a well-nourished, elderly lady, in no apparent distress.  HEENT: Pupils are equal, round and reactive to light and accommodation. Extraocular movements intact. Sclerae nonicteric. She has no difficulty hearing.  NECK: Supple with no masses and is nontender with no cervical adenopathy and no JVD noted. RESPIRATORY: She is clear to auscultation bilaterally with no rales, no rhonchi, no wheezing, no diminished breath sounds and in no respiratory distress.  CARDIOVASCULAR: Shows a regular rate and rhythm with a 2/6 systolic murmur. She has good pedal pulses and no lower extremity edema.  ABDOMEN: Soft, nontender, nondistended with good bowel sounds and no hepatosplenomegaly appreciated.  MUSCULOSKELETAL: She has 5/5 muscular strength. No cyanosis. She has full range of motion throughout all extremities.  SKIN: There is no rash or lesion noted. Her skin is warm and dry.  LYMPHATICS: No adenopathy noted.  NEUROLOGIC: Cranial nerves II through XII are grossly intact. Sensation is intact. She has no dysarthria and no dysphasia.   PSYCHIATRIC: She is alert and oriented x3. She is cooperative and has good insight.   LABORATORY DATA: No labs have resulted at this time.   ASSESSMENT AND PLAN:  1.  Left upper extremity traumatic hematoma. This is due to traumatic access of her AV graft during her dialysis session 2 days ago. She currently has a compression bandage in place and has been using ice on this. We will keep her n.p.o. for now with a  vascular consult just in case they want to do any kind of procedure today. We are also holding her aspirin and holding any DVT chemoprophylaxis so as not to exacerbate any of the bleeding into the hematoma.  2.  End-stage renal disease, on hemodialysis. The patient was able to complete her dialysis session on Friday. She is due again on Monday for dialysis. We will continue phosphate binders at this time. She will need a nephrology consult if she stays until Monday for dialysis support.  3.  Diabetes mellitus. Per the patient's report, this is well controlled. She is only on evening Levemir 2 to 4 units, and she uses this depending on how high her blood sugar runs. She states that when her blood sugar is not high if she takes this it makes her blood sugar go too low. We have her on sliding scale insulin here. We are holding Levemir for now as she is n.p.o. She will need a diabetic diet when she is able to eat again.  4.  Hypertension. This is relatively well controlled per the patient's report. We will continue her home antihypertensive medications and monitor her blood pressure closely.  5.  GERD. This is stable on her current PPI regimen. We will continue her PPI here.  6.  Hypothyroidism. The patient has no complaints in terms of her thyroid and no symptoms of hypo- or hyperthyroidism. We will continue her home dose of levothyroxine here.  7.  Deep vein thrombosis prophylaxis will be with SCDs for mechanical prophylaxis at this time until her hematoma has stabilized.   The patient is full code.   TIME SPENT ON THIS ADMISSION: 45 minutes.    ____________________________ Candace Cruise. Anne Hahn, MD dfw:JT D: 08/18/2014 04:25:57 ET T: 08/18/2014 07:23:13 ET JOB#: [id number]  cc: Candace Cruise. Anne Hahn, MD, <Dictator> Bing Duffey Scotty Court MD ELECTRONICALLY SIGNED 08/28/2014 8:40

## 2014-11-10 NOTE — Op Note (Signed)
PATIENT NAME:  [name], [name] MR#:  [id number] DATE OF BIRTH:  07-16-1940  DATE OF PROCEDURE:  09/05/2014  PREOPERATIVE DIAGNOSES: 1. Large left upper arm hematoma causing compressive symptoms and skin threat without signs of infection.  2. End-stage renal disease.  3. Hypertension.   POSTOPERATIVE DIAGNOSES:   1. Large left upper arm hematoma causing compressive symptoms and skin threat without signs of infection.  2. End-stage renal disease.  3. Hypertension.   PROCEDURES: Evacuation of left arm hematoma.   SURGEON: Annice NeedyJason S. Dew, MD   ANESTHESIA: General.   ESTIMATED BLOOD LOSS: 10 mL.   INDICATION FOR PROCEDURE: This is a female well known to our practice for her dialysis access needs. She has developed an extremely large left arm hematoma secondary to a poor stick at her dialysis access center. It appears as if they stuck her DRIL bypass rather than her graft for dialysis. This resulted in a very large hematoma. We watched this conservatively, as it showed no signs of infection; however, it has caused severe pain from compresses symptoms and now has areas of skin threat. Her graft is still patent as is the drill bypass, and we are trying to salvage these, so we are evacuating the hematoma. Risks and benefits were discussed. Informed consent was obtained.   DESCRIPTION OF PROCEDURE: The patient is brought to the operative suite and after an adequate level of general anesthesia was obtained, the left extremity was sterilely prepped and draped, and a sterile surgical field was created. About a 2 cm incision was created around an area of skin threat in the mid upper arm. This was several centimeters away from both the DRIL bypass and the dialysis access to try not to endanger either one of these. On initial incision, the hematoma was opened and a very large volume of old clotted jelly-appearing blood was evacuated. This was between 150 and 200 mL of old blood that was easily evacuated. I  then copiously irrigated the wound, getting out some more old blood and clots, and there was no ongoing or active bleeding. I then closed the wound with 3-0 Vicryl and then 3-0 nylon and placed a dry dressing. The patient tolerated the procedure well and was taken to the recovery room in stable condition.     ____________________________ Annice NeedyJason S. Dew, MD jsd:mw D: 09/05/2014 15:28:46 ET T: 09/06/2014 15:31:36 ET JOB#: [id number]  cc: Annice NeedyJason S. Dew, MD, <Dictator> Annice NeedyJASON S DEW MD ELECTRONICALLY SIGNED 09/19/2014 10:34

## 2014-11-10 NOTE — Consult Note (Signed)
CHIEF COMPLAINT and HISTORY:  Subjective/Chief Complaint left arm pain and swelling after dialysis Friday   History of Present Illness Patient is a complex ESRD patient who has undergone a left arm AVF which is her dialysis access in the past.  She developed steal syndrome, and a DRIL procedure was done on the left arm to successfully correct this.  She was doing fine until dialysis Friday when a tech who had never stuck her before stuck her in a different area.  This may have been in her DRIL bypass and not her actual fistula.  This resulted in severe swelling and hematoma of the LUE.  The pain is a little better today but has been severe and persistent.  Her arm is markedly swollen.  No active bleeding.  No hypotension.  AVF still has a thrill present and hand is warm.  No pain in hand like when she had steal.   PAST MEDICAL/SURGICAL HISTORY:  Past Medical History:   Hypercholesterolemia:    Diabetes:    hypertension:    Renal Failure:    Back Surgery:    exploratory laparotomy:    Breast Surgery:    Cholecystectomy:    Appendectomy:    Cystoscopy:    rt patella replaced:   ALLERGIES:  Allergies:  Penicillin: Hives, Rash  Sulfa drugs: Hives  Non-Steroidal Anti-Inflammatory Drugs: Bleeding  cannabinoids: Itching  HOME MEDICATIONS:  Home Medications: Medication Instructions Status  Levemir FlexTouch 100 units/mL subcutaneous solution 2 unit(s) - 4 unit(s) subcutaneous once a day (at bedtime) Active  levothyroxine 50 mcg (0.05 mg) oral capsule 1 cap(s) orally once a day Active  Aggrenox 25 mg-200 mg oral capsule, extended release 1 cap(s) orally 2 times a day Active  Dexilant 60 mg oral delayed release capsule 1 cap(s) orally once a day Active  carvedilol 25 mg oral tablet 1 tab(s) orally 2 times a day Active  lisinopril 20 mg oral tablet 1 tab(s) orally 2 times a day Active  pravastatin 40 mg oral tablet 1 tab(s) orally once a day (at bedtime) Active  DULoxetine 30 mg  oral delayed release capsule 1 cap(s) orally once a day Active  Aspirin Low Dose 81 mg oral delayed release tablet 2 tab(s) orally once a day Active  diltiazem 180 mg/24 hours oral tablet, extended release 1 tab(s) orally once a day (in the morning) Active  Vitamin D2 50,000 intl units oral capsule 1 cap(s) orally once a week Active  Renvela carbonate 800 mg oral tablet 2 tab(s) orally 3 times a day (with meals) Active  oxyCODONE 10 mg oral tablet 1 tab(s) orally every 8 hours, As Needed - for Pain Active  pantoprazole 40 mg oral delayed release tablet 1 tab(s) orally once a day (in the morning) Active  Rena-Vite Vitamin B Complex with C and Folic Acid oral tablet 1 tab(s) orally once a day Active  OxyCONTIN 20 mg oral tablet, extended release 1 tab(s) orally every 12 hours, As Needed - for Pain Active   Family and Social History:  Family History Cancer  Stroke  renal failure   Social History positive tobacco (Greater than 1 year), negative ETOH, negative Illicit drugs   Place of Living Home   Review of Systems:  Subjective/Chief Complaint No TIA/stroke/seizure No heat or cold intolerance No dysuria/hematuria.  Has ESRD No blurry or double vision No tinnitus or ear pain No rashes or ulcer No suicidal ideation or psychosis Hematoma present in left arm, but bleeding was provoked not spontaneous No  SOB/DOE, orthopnea, or sputum No palpitations or chest pain No N/V/D or abdominal pain Arm symptoms as per HPI No fever or chills No unintentional weight loss or gain   Medications/Allergies Reviewed Medications/Allergies reviewed   Physical Exam:  GEN well developed, well nourished, no acute distress   HEENT pink conjunctivae, hearing intact to voice   NECK No masses  trachea midline   RESP normal resp effort  no use of accessory muscles   CARD regular rate  no JVD   VASCULAR ACCESS AV fistula present  Good bruit  Good thrill  The arm is markedly swollen from hematoma.  The  access remains open and her perfusion distally appears intact with good cap refill and pulse at the wrist present.   ABD denies tenderness  soft   GU no superpubic tenderness   LYMPH negative neck, negative axillae   EXTR negative cyanosis/clubbing, positive edema, left arm swelling as above   SKIN normal to palpation, No ulcers, left arm hematoma with mild blistering, but no open skin wounds   NEURO cranial nerves intact, follows commands, motor/sensory function intact   PSYCH alert, A+O to time, place, person, good insight   LABS:  Laboratory Results: Routine Chem:    07-Feb-16 00:34, Basic Metabolic Panel (w/Total Calcium)  Glucose, Serum 79  BUN 53  Creatinine (comp) 7.07  Sodium, Serum 143  Potassium, Serum 5.4  Chloride, Serum 105  CO2, Serum 25  Calcium (Total), Serum 7.9  Anion Gap 13  Osmolality (calc) 298  eGFR (African American) 7  eGFR (Non-African American) 6  eGFR values <81m/min/1.73 m2 may be an indication of chronic  kidney disease (CKD).  Calculated eGFR, using the MRDR Study equation, is useful in   patients with stable renal function.  The eGFR calculation will not be reliable in acutely ill patients  when serum creatinine is changing rapidly. It is not useful in  patients on dialysis. The eGFR calculation may not be applicable  to patients at the low and high extremes of body sizes, pregnant  women, and vegetarians.  Result Comment   POTASSIUM/BUN/CREATININE - Slight hemolysis, interpret results with   - caution.   Result(s) reported on 18 Aug 2014 at 06:47AM.  Routine Hem:    07-Feb-16 05:47, CBC Profile  WBC (CBC) 9.4  RBC (CBC) 2.74  Hemoglobin (CBC) 9.4  Hematocrit (CBC) 29.4  Platelet Count (CBC) 105  MCV 107  MCH 34.5  MCHC 32.2  RDW 17.4  Neutrophil % 63.8  Lymphocyte % 22.3  Monocyte % 10.4  Eosinophil % 2.6  Basophil % 0.9  Neutrophil # 6.0  Lymphocyte # 2.1  Monocyte # 1.0  Eosinophil # 0.2  Basophil # 0.1  Result(s)  reported on 18 Aug 2014 at 06:47AM.   RADIOLOGY:  Radiology Results: XRay:    18-Aug-15 15:45, Chest PA and Lateral  Chest PA and Lateral  REASON FOR EXAM:    htn,diabetes  COMMENTS:       PROCEDURE: DXR - DXR CHEST PA (OR AP) AND LATERAL  - Feb 26 2014  3:45PM     CLINICAL DATA:  Preop    EXAM:  CHEST  2 VIEW    COMPARISON:  None.    FINDINGS:  Cardiomediastinal silhouette is unremarkable. Metallic fixation rods  noted lower thoracic and lumbar spine. Dual-lumen right IJ dialysis  catheter in place. No acute infiltrate or pleural effusion. No  pulmonary edema. Is     IMPRESSION:  No active cardiopulmonary disease.  Electronically Signed    By: Lahoma Crocker M.D.    On: 02/26/2014 15:47         Verified By: Ephraim Hamburger, M.D.,  LabUnknown:  PACS Image   ASSESSMENT AND PLAN:  Assessment/Admission Diagnosis ESRD with large hematoma from poor stick at dialysis center yesterday.  I suspect that the tech stuck her DRIL bypass and not her fistula. Steal syndrome, resolved with DRIL bypass.  Bypass appears clinically open with good hand perfusion Other medical issues as above, being managed by primary service.   Plan The arm is very swollen, and I do not think her fistula will be useable for a couple of weeks.  We will place a permcath tomorrow for her access.  The fistula is still open, and hopefully this will remain open.  The DRIL bypass appears open clinically, but I think this was probably the area that was stuck and bled.  Not activlely bleeding at this point, but this may need addressed with arterial duplex (not available here in hospital) or angiogram if swelling worsens.  She understands the fistula could be lost.  She understands need for catheter.     level 4 consult   Electronic Signatures: Algernon Huxley (MD)  (Signed 07-Feb-16 09:58)  Authored: Chief Complaint and History, PAST MEDICAL/SURGICAL HISTORY, ALLERGIES, HOME MEDICATIONS, Family and Social History,  Review of Systems, Physical Exam, LABS, RADIOLOGY, Assessment and Plan   Last Updated: 07-Feb-16 09:58 by Algernon Huxley (MD)

## 2014-11-10 NOTE — Op Note (Signed)
PATIENT NAME:  [name], [name] MR#:  [id number] DATE OF BIRTH:  04/11/1941  DATE OF PROCEDURE:  11/05/2014.  PREOPERATIVE DIAGNOSES:  1. Complication of left arm brachioaxillary dialysis graft with inability to access and hematoma formation.  2. 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  3. Hypertension.  4. Diabetes.   POSTOPERATIVE DIAGNOSES: 1. Complication of left arm brachioaxillary dialysis graft with inability to access and hematoma formation.  2. 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  3. Hypertension.  4. Diabetes.   PROCEDURES PERFORMED:  1. Contrast injection, left arm brachioaxillary dialysis graft.  2. Percutaneous transluminal angioplasty of the venous anastomosis, left arm brachioaxillary dialysis graft.   SURGEON:  Renford Dills, MD.  SEDATION:  Versed plus fentanyl.   CONTRAST USED:  Isovue 25 mL.   FLUOROSCOPY TIME:  1.5 minutes.   ACCESS:  6-French sheath, antegrade direction, left arm brachioaxillary dialysis graft.   INDICATIONS:  [name] is a 74 year old woman who has undergone placement of a left arm AV graft and subsequently developed steal syndrome.  This was treated with revascularization with interval ligation, and her hand pain has resolved.  Subsequently, she developed a massive hematoma during dialysis and was found to have a high-grade venous stenosis.  Duplex ultrasound had also suggested a high-grade arterial stenosis at the origin of the DRIL bypass.  Risks and benefits for contrast injection and intervention are reviewed. All questions answered.  The patient has agreed to proceed.   DESCRIPTION OF PROCEDURE:  The patient is taken to special procedures and placed in the supine position. After adequate sedation is achieved, the patient is positioned supine  with her left arm extended palm upward.  The left arm is prepped and draped in usual sterile fashion. Appropriate timeout is called.   Lidocaine has been infiltrated over the graft near the arterial anastomosis of the AV graft, and in an antegrade direction, a microneedle is inserted, microwire followed by micro sheath, J-wire followed by a 6-French sheath.  Hand injection of contrast then demonstrated the graft as well as the venous anastomosis and the central veins.  After review of these images, there is a string sign at the venous anastomosis but otherwise the axillary vein, subclavian vein, innominate, and superior vena cava are widely patent.  Catheter is noted and it is in good position.   3000 units of heparin is given, and a Kumpe catheter and Magic Torque wire are then negotiated through the string sign and positioned with the tip of the wire in the central venous system. Initially a 6 x 4 Dorado balloon is inflated across the lesion, then a 7 x 4 Lutonix balloon, and finally an 8 x 6 balloon.  Followup imaging demonstrated less than 10% residual stenosis at the venous anastomosis with rapid flow of contrast now through the axillary vein.  With the 8 mm balloon inflated, reflux imaging was performed which demonstrated the arterial anastomosis as well as the DRIL bypass anastomosis.  There is no evidence of a stricture or stenosis at the anastomosis for the DRIL bypass.   Purse-string suture is placed, sheath is removed, light pressure is held, and there are no immediate complications.   INTERPRETATION:  The graft itself appears to be in excellent condition without any abnormalities at the venous anastomosis.  There is a string sign of the axillary vein.  More proximally, the  axillary vein is widely patent as is the subclavian, innominate, and superior vena cava.   Following angioplasty to 6 mm, then 7 mm, then 8 mm, with the 7 mm inflation being a Lutonix  balloon, there is now wide patency of  the venous anastomosis with less than 10% residual stenosis.   DRIL bypass is widely patent.   SUMMARY:  Successful salvage of left arm brachioaxillary dialysis graft as described above.      ____________________________ Renford DillsGregory G. Schnier, MD ggs:kc D: 11/05/2014 15:51:15 ET T: 11/05/2014 22:38:52 ET JOB#: [id number]  cc: Renford DillsGregory G. Schnier, MD, <Dictator> Renford DillsGREGORY G SCHNIER MD ELECTRONICALLY SIGNED 11/08/2014 13:05

## 2014-11-10 NOTE — Op Note (Signed)
PATIENT NAME:  [name], Heather C MR#:  [id number] DATE OF BIRTH:  09-24-1940  DATE OF PROCEDURE:  08/19/2014  PREOPERATIVE DIAGNOSES:   1.  End-stage renal disease.  2.  Left arm hematoma precluding access use.  POSTOPERATIVE DIAGNOSES: 1.  End-stage renal disease.  2.  Left arm hematoma precluding access use.  PROCEDURES: 1.  Ultrasound guidance for vascular access to left internal jugular vein.  2.  Fluoroscopic guidance for placement of catheter.  3. Placement of a 23 cm tip-to-cuff tunneled hemodialysis catheter via the left internal jugular vein.   SURGEON:  Annice NeedyJason S. Mckinley Adelstein, MD  ANESTHESIA: Local with sedation.   BLOOD LOSS: Minimal.   INDICATION FOR PROCEDURE: 74 year old African American female who was admitted over the weekend with severe left arm swelling and hematoma after infiltration with dialysis on Friday. It is unclear to me if she had her  DRIL bypass stuck or her access stuck, and I suspect the former. Nonetheless, the hematoma will preclude use of this arm for the next couple of weeks. We are placing a PermCath for her dialysis access in the interim. Risks and benefits were discussed. Informed consent was obtained.   DESCRIPTION OF THE PROCEDURE: The patient was brought to the vascular and interventional radiology suite. The patient's left neck and chest were sterilely prepped and draped and a sterile surgical field was created. The left internal jugular vein was visualized with ultrasound and found to be patent. It was then accessed under direct ultrasound guidance and a permanent image was recorded. A wire was placed. After a skin nick and dilatation, the peel-away sheath was placed over the wire.   I then turned my attention to an area under the clavicle. Approximately 2 fingerbreadths below the clavicle a small counter incision was created and we tunneled from the subclavicular incision to the access site. Using fluoroscopic guidance, a 23 cm tip-to-cuff tunneled  hemodialysis catheter was selected, tunneled from the subclavicular incision to the access site. It was then placed through the peel-away sheath and the peel-away sheath was removed. The catheter tips were parked in the right atrium. The appropriate distal connectors were placed. It withdrew blood well and flushed easily with heparinized saline and a concentrated heparin solution was then placed. It was secured to the chest wall with 2 Prolene sutures. The access incision was closed with a single 4-0 Monocryl. A 4-0 Monocryl pursestring suture was placed around the exit site. Sterile dressings were placed.   The patient tolerated the procedure well and was taken to the recovery room in stable condition.    ____________________________ Annice NeedyJason S. Makhya Arave, MD jsd:MT D: 08/19/2014 09:14:09 ET T: 08/19/2014 09:31:04 ET JOB#: [id number]  cc: Annice NeedyJason S. Zariya Minner, MD, <Dictator> Annice NeedyJASON S Kaevion Sinclair MD ELECTRONICALLY SIGNED 08/21/2014 14:06

## 2014-11-10 NOTE — Op Note (Signed)
PATIENT NAME:  AERICA, [name] MR#:  [id number] DATE OF BIRTH:  [birth year]  DATE OF PROCEDURE:  09/03/2014  PREOPERATIVE DIAGNOSES:  1.  Massive hematoma complicating dialysis access, left arm.  2.  Left upper extremity pain.  3.  End-stage renal disease requiring hemodialysis.  4.  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   POSTOPERATIVE DIAGNOSES: 1.  Massive hematoma complicating dialysis access, left arm.  2.  Left upper extremity pain.  3.  End-stage renal disease requiring hemodialysis.  4.  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   PROCEDURE PERFORMED:   1.  Left upper extremity angiography, third order catheter placement.  2.  Percutaneous transluminal angioplasty of the DRIL bypass using a Lutonix balloon.   SURGEON:  Levora Dredge, M.D.   SEDATION:  Versed plus fentanyl.   CONTRAST USED: Isovue 60 mL.   FLUOROSCOPY TIME:  3.2 minutes.   INDICATIONS: [name] is a 74 year old woman who is maintained on hemodialysis via a left arm brachial axillary dialysis graft. She has had troubles with ischemic hand or steal syndrome from her fistula and therefore, has undergone a DRIL bypass procedure using reversed saphenous vein. Last week during dialysis she apparently suffered an access or attempted access of the DRIL bypass of the arterial bypass and not of the brachial axillary graft.  I am uncertain as to the conditions that resulted in this; however, following this, she developed a massive hematoma, which has caused several areas of skin necrosis. She is in extreme pain. There is significant concern that this represents a persistent pseudoaneurysm and therefore, she is undergoing angiography to exclude this prior to evacuation of a hematoma. Risks and benefits have been reviewed. The treatment plan have been discussed. She is in agreement with proceeding.   DESCRIPTION OF PROCEDURE: The patient is taken to special procedures and placed in the supine position.  After adequate sedation is achieved, both groins are prepped and draped in a sterile fashion and she is positioned supine with her left arm extended palm upward. Appropriate timeout is called.   One percent lidocaine is infiltrated into the right groin. Ultrasound is placed in a sterile sleeve. The common femoral artery is identified. It is echolucent and pulsatile indicating patency. Image is recorded for the permanent record and under real-time visualization, a microneedle is inserted, microwire followed by micro sheath, J-wire followed by a 5 French sheath and 5 French pigtail catheter. Pigtail catheter is advanced into the ascending aorta and an LAO projection of the arch is obtained. After review of the image a subsequently a Bentson Hanafee catheter are used to access the left subclavian Glidewire and catheter are advanced out to the mid subclavian. Image is obtained by hand injection, then the catheter is advanced into the brachial. Images obtained by hand injection and subsequently the catheter is advanced into the actual DRIL bypass. Again, distal imaging is obtained and subsequently there is identified, a narrowing. I suspect this is at the site where she was cannulated within the bypass graft. Vein distally appears quite good and therefore, a Versacore wire is introduced.  A 6 French shuttle sheath is advanced up and into the brachial artery proximally and 3000 units of heparin is given.   A 4 x 100 balloon is advanced across the brachial artery arterial anastomosis and the area of narrowing in the vein bypass. Inflation is to six atmospheres for three minutes. Subsequently imaging demonstrates a small focal area that is persistently narrowed and a 5 x  4 balloon is used to treat this inflated to eight atmospheres for a few minutes.   Follow-up imaging demonstrates there is now excellent appearance to the graft, there is no evidence of pseudoaneurysm and therefore, the sheath is pulled back into the  right external iliac oblique view is obtained and a StarClose device deployed.   There are no immediate complications.   INTERPRETATION: The arch is opacified with a bolus injection of contrast. There are no ostial stenoses. There is a bovine appearance to the arch. There is perhaps, 30 to 40% narrowing at the origin of the left subclavian.   The left subclavian and axillary veins are widely patent. Brachial artery is widely patent. The DRIL bypass is noted and this has an area of narrowing in its more proximal segment which corresponds to where she appears to have been stuck or cannulated. The distal brachial artery is widely patent down to the AV access and the AV access demonstrates multiple outflow veins with the true axillary vein being somewhat narrowed, but again, extensive tributaries in this area 4 to 5 of which are 3 to 4 mm in diameter. The remaining axillary vein subclavian vein and innominate vein appear widely patent. Distal ED DRIL bypass is widely patent and there is 3-vessel runoff to the wrist.   Following angioplasty first to 4 mm and then to 5 mm, there is resolution of this area concerned within the graft with rapid and then very improved flow through the DRIL bypass.   SUMMARY: Successful treatment of the DRIL bypass with angioplasty using a Lutonix balloon.  No evidence of pseudoaneurysm. I will have discussed this with the patient postoperatively and we will proceed with evacuation of the hematoma.  I have recommended tomorrow, Wednesday the 24th. She, unfortunately, cannot make it to the hospital and, therefore, plan will be for Thursday the 25th.    ____________________________ Renford DillsGregory G. Appolonia Ackert, MD ggs:at D: 09/04/2014 11:09:38 ET T: 09/04/2014 17:24:30 ET JOB#: [id number]  cc: Renford DillsGregory G. Nickalaus Crooke, MD, <Dictator> Renford DillsGREGORY G Lancelot Alyea MD ELECTRONICALLY SIGNED 09/17/2014 14:25

## 2014-11-10 NOTE — Discharge Summary (Signed)
PATIENT NAME:  ERNIE, SAGRERO MR#:  [id number] DATE OF BIRTH:  [birth year]  DATE OF ADMISSION:  08/18/2014 DATE OF DISCHARGE:  08/20/2014  DISCHARGE DIAGNOSES: 1.  Accidental rupture of fistula on left arm and hematoma.  2.  End-stage renal disease, on hemodialysis, PermCath placed.  3.  Hypertension.  4.  Diabetes.   CONDITION ON DISCHARGE: Stable.   MEDICATIONS ON DISCHARGE: 1.  Levothyroxine 50 mcg once a day.  2.  Aggrenox 25/200 mg capsule 2 times a day.  3.  Dexilant 60 mg oral delayed-release capsule once a day.  4.  Carvedilol 25 mg tablet 2 times a day.  5.  Lisinopril 10 mg 2 times a day.  6.  Pravastatin 40 mg once a day.  7.  Duloxetine 30 mg oral delayed-release once a day.  8.  Aspirin low dose 81 mg 2 tablets once a day.  9.  Diltiazem 180 mg extended release once a day.  10.  Vitamin D2 50,000 international units oral capsule once a week.  11.  Renvela 800 mg 2 tablets 3 times a day with meals.  12.  Oxycodone 10 mg oral tablet every 8 hours as needed for pain.  13.  Pantoprazole 40 mg delayed-release tablet once a day in the morning.  14.  OxyContin 20 mg oral tablet extended-release every 12 hours as needed for pain.  15.  Levemir 2 units to 4 units subcutaneous once a day at bedtime.   DIET ON DISCHARGE: Renal diet and advised to have regular consistency diet.  DISCHARGE FOLLOWUP: One to 2 weeks followup in vascular clinic with Dr. Festus Barren.   HISTORY OF PRESENT ILLNESS: A 74 year old female with significant past medical history significant for end-stage renal disease on hemodialysis who presented 2 day after development of hematoma in her left upper extremity. This was due to incorrect access of AV graft. Had hemodialysis on the previous day. After the dialysis, she noticed swelling and the dialysis staff recommended she put ice on it and take aspirin. One day later she was taken to [facility] Emergency Department because of severe swelling increase on her arm,  also some associated pain. She was diagnosed likely having hematoma over there and the doctor from [facility] Emergency Department called Dr. Festus Barren, vascular surgeon, and Dr. Gilda Crease, as he was the patient's vascular doctor and only doctor in this region for vascular practice, so he recommended transfer the patient to [facility] Emergency Room and they will take care of the patient over here.   HOSPITAL COURSE AND STAY:  1.  From vascular team we found out that alongside the patient's AV graft the patient had some steal syndrome in her distal hand and arm because of the graft and so vascular doctors had put a DRIL graft in her artery and accidentally that was accessed by the hemodialysis staff and that is why she had hematoma. Now, because of this big hematoma, her AV graft could not be used for dialysis so there was a compression bandage applied to her arm. PermCath was placed by vascular team and the patient received hemodialysis through that. She remained stable, hemoglobin was stable and vascular doctor suggested as an effort of salvaging her graft, they would not like to do any surgery currently and let the hematoma resolve and see if they can salvage the graft so she was advised to follow in the office after a week which the understands and agreed.  2.  Diabetes. Stable on insulin sliding scale  coverage.  3.  Hypertension. Stable on carvedilol, lisinopril and Cardizem.  4.  Hyperlipidemia. Continued Pravachol and stable.  5.  Hypothyroidism. Continued Synthroid.  6.  Secondary hyperparathyroidism. Continued Renvela. 7.  Depression. Continued Cymbalta.   CONSULTANTS IN HOSPITAL:  1.  Vascular, Dr. Festus BarrenJason Dew.  2.  Nephrology, Dr. Mosetta PigeonHarmeet Singh.  IMPORTANT LAB REPORTS IN HOSPITAL: WBC count 9.4, hemoglobin 9.4 on admission, platelet count 105, and MCV 107.   Glucose level 79, BUN 53, creatinine 7.07, sodium 143, potassium 5.4, chloride 105, and CO2 25 on admission.   Hemoglobin  remained 8 on 8th of February and 8.2 on 9th of February.   TOTAL TIME SPENT ON THIS DISCHARGE: 40 minutes.   ____________________________ Hope PigeonVaibhavkumar G. Elisabeth PigeonVachhani, MD vgv:sb D: 08/23/2014 09:01:24 ET T: 08/23/2014 12:38:25 ET JOB#: [id number]  cc: Hope PigeonVaibhavkumar G. Elisabeth PigeonVachhani, MD, <Dictator> Annice NeedyJason S. Dew, MD Altamese DillingVAIBHAVKUMAR Alanda Colton MD ELECTRONICALLY SIGNED 08/28/2014 15:58

## 2015-01-21 ENCOUNTER — Ambulatory Visit: Admission: AD | Admit: 2015-01-21 | Payer: Self-pay | Admitting: Vascular Surgery

## 2015-01-21 ENCOUNTER — Ambulatory Visit
Admission: RE | Admit: 2015-01-21 | Discharge: 2015-01-21 | Disposition: A | Discharge status: Home / Self Care | Payer: Medicare Other | Source: Ambulatory Visit | Attending: Vascular Surgery | Admitting: Vascular Surgery

## 2015-01-21 ENCOUNTER — Encounter: Payer: Self-pay | Admitting: *Deleted

## 2015-01-21 ENCOUNTER — Encounter
Admission: RE | Disposition: A | Discharge status: Home / Self Care | Payer: Self-pay | Source: Ambulatory Visit | Attending: Vascular Surgery

## 2015-01-21 DIAGNOSIS — N186 End stage renal disease: Secondary | ICD-10-CM | POA: Insufficient documentation

## 2015-01-21 DIAGNOSIS — E119 Type 2 diabetes mellitus without complications: Secondary | ICD-10-CM | POA: Insufficient documentation

## 2015-01-21 DIAGNOSIS — T82858A Stenosis of vascular prosthetic devices, implants and grafts, initial encounter: Secondary | ICD-10-CM | POA: Diagnosis not present

## 2015-01-21 DIAGNOSIS — Z7982 Long term (current) use of aspirin: Secondary | ICD-10-CM | POA: Diagnosis not present

## 2015-01-21 DIAGNOSIS — I120 Hypertensive chronic kidney disease with stage 5 chronic kidney disease or end stage renal disease: Secondary | ICD-10-CM | POA: Insufficient documentation

## 2015-01-21 DIAGNOSIS — X58XXXA Exposure to other specified factors, initial encounter: Secondary | ICD-10-CM | POA: Diagnosis not present

## 2015-01-21 DIAGNOSIS — Z87891 Personal history of nicotine dependence: Secondary | ICD-10-CM | POA: Diagnosis not present

## 2015-01-21 DIAGNOSIS — Z8673 Personal history of transient ischemic attack (TIA), and cerebral infarction without residual deficits: Secondary | ICD-10-CM | POA: Insufficient documentation

## 2015-01-21 DIAGNOSIS — Z992 Dependence on renal dialysis: Secondary | ICD-10-CM | POA: Diagnosis not present

## 2015-01-21 DIAGNOSIS — Z794 Long term (current) use of insulin: Secondary | ICD-10-CM | POA: Insufficient documentation

## 2015-01-21 HISTORY — DX: Essential (primary) hypertension: I10

## 2015-01-21 HISTORY — DX: Type 2 diabetes mellitus without complications: E11.9

## 2015-01-21 HISTORY — PX: PERIPHERAL VASCULAR CATHETERIZATION: SHX172C

## 2015-01-21 HISTORY — DX: Cerebral infarction, unspecified: I63.9

## 2015-01-21 HISTORY — DX: Chronic kidney disease, unspecified: N18.9

## 2015-01-21 LAB — GLUCOSE, CAPILLARY: GLUCOSE-CAPILLARY: 83 mg/dL (ref 65–99)

## 2015-01-21 LAB — POTASSIUM (ARMC VASCULAR LAB ONLY): Potassium (ARMC vascular lab): 5.4

## 2015-01-21 SURGERY — A/V SHUNTOGRAM/FISTULAGRAM
Anesthesia: Moderate Sedation

## 2015-01-21 SURGERY — DIALYSIS/PERMA CATHETER REMOVAL
Anesthesia: Moderate Sedation

## 2015-01-21 MED ORDER — FENTANYL CITRATE (PF) 100 MCG/2ML IJ SOLN
INTRAMUSCULAR | Status: AC
  Filled 2015-01-21: qty 2

## 2015-01-21 MED ORDER — HEPARIN SODIUM (PORCINE) 1000 UNIT/ML IJ SOLN
INTRAMUSCULAR | Status: DC | PRN
Start: 2015-01-21 — End: 2015-01-21
  Administered 2015-01-21: 3000 [IU] via INTRAVENOUS

## 2015-01-21 MED ORDER — CEFAZOLIN SODIUM 1-5 GM-% IV SOLN
INTRAVENOUS | Status: AC
Start: 2015-01-21 — End: 2015-01-21
  Filled 2015-01-21: qty 50

## 2015-01-21 MED ORDER — SODIUM CHLORIDE 0.9 % IV SOLN
INTRAVENOUS | Status: DC
  Administered 2015-01-21: 14:00:00 via INTRAVENOUS

## 2015-01-21 MED ORDER — ACETAMINOPHEN 500 MG PO TABS: 500.0000 mg | ORAL_TABLET | Freq: Once | ORAL | Status: DC

## 2015-01-21 MED ORDER — MIDAZOLAM HCL 5 MG/5ML IJ SOLN
INTRAMUSCULAR | Status: AC
  Filled 2015-01-21: qty 5

## 2015-01-21 MED ORDER — BACITRACIN-NEOMYCIN-POLYMYXIN 400-5-5000 EX OINT
TOPICAL_OINTMENT | CUTANEOUS | Status: AC
  Filled 2015-01-21: qty 1

## 2015-01-21 MED ORDER — HEPARIN (PORCINE) IN NACL 2-0.9 UNIT/ML-% IJ SOLN
INTRAMUSCULAR | Status: AC
  Filled 2015-01-21: qty 1000

## 2015-01-21 MED ORDER — DIPHENHYDRAMINE HCL 50 MG/ML IJ SOLN
INTRAMUSCULAR | Status: DC | PRN
  Administered 2015-01-21: 50 mg via INTRAVENOUS

## 2015-01-21 MED ORDER — MIDAZOLAM HCL 2 MG/2ML IJ SOLN
INTRAMUSCULAR | Status: DC | PRN
  Administered 2015-01-21: 1 mg via INTRAVENOUS
  Administered 2015-01-21: 2 mg via INTRAVENOUS

## 2015-01-21 MED ORDER — DIPHENHYDRAMINE HCL 50 MG/ML IJ SOLN
INTRAMUSCULAR | Status: AC
  Filled 2015-01-21: qty 1

## 2015-01-21 MED ORDER — ONDANSETRON HCL 4 MG/2ML IJ SOLN
INTRAMUSCULAR | Status: AC
  Administered 2015-01-21: 4 mg
  Filled 2015-01-21: qty 2

## 2015-01-21 MED ORDER — ACETAMINOPHEN 500 MG PO TABS
ORAL_TABLET | ORAL | Status: AC
Start: 2015-01-21 — End: 2015-01-21
  Administered 2015-01-21: 1000 mg
  Filled 2015-01-21: qty 2

## 2015-01-21 MED ORDER — FENTANYL CITRATE (PF) 100 MCG/2ML IJ SOLN
INTRAMUSCULAR | Status: DC | PRN
  Administered 2015-01-21: 50 ug via INTRAVENOUS
  Administered 2015-01-21: 25 ug via INTRAVENOUS

## 2015-01-21 MED ORDER — CLINDAMYCIN PHOSPHATE 300 MG/50ML IV SOLN
INTRAVENOUS | Status: AC
  Administered 2015-01-21: 16:00:00
  Filled 2015-01-21: qty 50

## 2015-01-21 MED ORDER — CLINDAMYCIN PHOSPHATE 300 MG/50ML IV SOLN: 300.0000 mg | Freq: Once | INTRAVENOUS | Status: DC

## 2015-01-21 MED ORDER — IOHEXOL 300 MG/ML  SOLN
INTRAMUSCULAR | Status: DC | PRN
  Administered 2015-01-21: 30 mL via INTRAVENOUS

## 2015-01-21 MED ORDER — HEPARIN SODIUM (PORCINE) 1000 UNIT/ML IJ SOLN
INTRAMUSCULAR | Status: AC
  Filled 2015-01-21: qty 1

## 2015-01-21 MED ORDER — LIDOCAINE HCL (PF) 1 % IJ SOLN
INTRAMUSCULAR | Status: AC
  Filled 2015-01-21: qty 10

## 2015-01-21 SURGICAL SUPPLY — 12 items
BALLN LUTONIX DCB 7X60X130 (BALLOONS) ×3
BALLN ULTRV 018 8X40X75 (BALLOONS) ×3 IMPLANT
BALLOON LUTONIX DCB 7X60X130 (BALLOONS) ×2 IMPLANT
CANNULA NASAL 8 HUDSON (TUBING) ×3 IMPLANT
DEVICE INFLATION 20/30 (MISCELLANEOUS) ×3 IMPLANT
DRAPE BRACHIAL (DRAPES) ×3 IMPLANT
PACK ANGIOGRAPHY (CUSTOM PROCEDURE TRAY) ×3 IMPLANT
SET INTRO CAPELLA COAXIAL (SET/KITS/TRAYS/PACK) ×3 IMPLANT
SHEATH BRITE TIP 6FRX5.5 (SHEATH) ×3 IMPLANT
SUT MON AB 4-0 PC3 18 (SUTURE) ×3 IMPLANT
TOWEL OR 17X26 4PK STRL BLUE (TOWEL DISPOSABLE) ×3 IMPLANT
WIRE MAGIC TORQUE 260C (WIRE) ×3 IMPLANT

## 2015-01-21 NOTE — Discharge Instructions (Signed)
AV Fistula, Care After °Refer to this sheet in the next few weeks. These instructions provide you with information on caring for yourself after your procedure. Your caregiver may also give you more specific instructions. Your treatment has been planned according to current medical practices, but problems sometimes occur. Call your caregiver if you have any problems or questions after your procedure. °HOME CARE INSTRUCTIONS  °· Do not drive a car or take public transportation alone. °· Do not drink alcohol. °· Only take medicine that has been prescribed by your caregiver. °· Do not sign important papers or make important decisions. °· Have a responsible person with you. °· Ask your caregiver to show you how to check your access at home for a vibration (called a "thrill") or for a sound (called a "bruit" pronounced brew-ee). °· Your vein will need time to enlarge and mature so needles can be inserted for dialysis. Follow your caregiver's instructions about what you need to do to make this happen. °· Keep dressings clean and dry. °· Keep the arm elevated above your heart. Use a pillow. °· Rest. °· Use the arm as usual for all activities. °· Have the stitches or tape closures removed in 10 to 14 days, or as directed by your caregiver. °· Do not sleep or lie on the area of the fistula or that arm. This may decrease or stop the blood flow through your fistula. °· Do not allow blood pressures to be taken on this arm. °· Do not allow blood drawing to be done from the graft. °· Do not wear tight clothing around the access site or on the arm. °· Avoid lifting heavy objects with the arm that has the fistula. °· Do not use creams or lotions over the access site. °SEEK MEDICAL CARE IF:  °· You have a fever. °· You have swelling around the fistula that gets worse, or you have new pain. °· You have unusual bleeding at the fistula site or from any other area. °· You have pus or other drainage at the fistula site. °· You have skin  redness or red streaking on the skin around, above, or below the fistula site. °· Your access site feels warm. °· You have any flu-like symptoms. °SEEK IMMEDIATE MEDICAL CARE IF:  °· You have pain, numbness, or an unusual pale skin on the hand or on the side of your fistula. °· You have dizziness or weakness that you have not had before. °· You have shortness of breath. °· You have chest pain. °· Your fistula disconnects or breaks, and there is bleeding that cannot be easily controlled. °Call for local emergency medical help. Do not try to drive yourself to the hospital. °MAKE SURE YOU °· Understand these instructions. °· Will watch your condition. °· Will get help right away if you are not doing well or get worse. °Document Released: 06/28/2005 Document Revised: 11/12/2013 Document Reviewed: 12/16/2010 °Exit[employer]® Patient Information ©2015 [employer]. This information is not intended to replace advice given to you by your health care provider. Make sure you discuss any questions you have with your health care provider. ° °

## 2015-01-21 NOTE — Op Note (Signed)
OPERATIVE NOTE   PROCEDURE: 1. Contrast injection left arm brachial axillary dialysis graft 2. Percutaneous transluminal and plasty to 8 mm venous anastomosis left brachial axillary dialysis graft  PRE-OPERATIVE DIAGNOSIS: Complication of dialysis access                                                       End Stage Renal Disease  POST-OPERATIVE DIAGNOSIS: same as above   SURGEON: Katha Cabal, M.D.  ANESTHESIA: Conscious Sedation   ESTIMATED BLOOD LOSS: minimal  FINDING(S): 1. Narrowing at the venous anastomosis graft otherwise appears to be in very good condition and acceptable for use  SPECIMEN(S):  None  CONTRAST: 30 cc  FLUOROSCOPY TIME: 3 minutes  INDICATIONS: [name] is a 74 y.o. female who  presents with an AV access that dialysis had difficulty with yesterday. She actually is presenting today for catheter removal but in light of this finding she will undergo contrast injection of her fistula to ensure there is no problem that would result in interruption of her dialysis therapy if her catheters removed..  The patient is scheduled for angiography with possible intervention of the AV access.  The patient is aware the risks include but are not limited to: bleeding, infection, thrombosis of the cannulated access, and possible anaphylactic reaction to the contrast.  The patient acknowledges if the access can not be salvaged a tunneled catheter will be needed and will be placed during this procedure.  The patient is aware of the risks of the procedure and elects to proceed with the angiogram and intervention.  DESCRIPTION: After full informed written consent was obtained, the patient was brought back to the Special Procedure suite and placed supine position.  Appropriate cardiopulmonary monitors were placed.  The left arm was prepped and draped in the standard fashion.  Appropriate timeout is called. The left brachial axillary graft  was cannulated with a  micropuncture needle.  The microwire was advanced and the needle was exchanged for  a microsheath.  The J-wire was then advanced and a 6 Fr sheath inserted.  Hand injections were completed to image the access from the arterial anastomosis through the entire access.  The central venous structures were also imaged by hand injections.  Based on the images,  the brachial axillary graft is widely patent. At the venous anastomosis there is a 65-70% narrowing. The remaining proximal portion of the axillary vein is widely patent as is the subclavian innominate and superior vena cava. Reflux imaging demonstrates that the arterial anastomosis is widely patent the visualized portions of the brachial artery are widely patent drill bypass is present and this is widely patent as well  3000 units of heparin was given and a Magic torque wire is advanced across the lesion a 7 x 4 Lutonix balloon is then inflated to 14 atm for approximately 2 minutes. Follow-up imaging demonstrates there is still residual stenosis of greater than 20% and an 8 x 4 balloon is advanced across the lesion and inflated to 10 atm for 1 minute. Follow-up imaging demonstrates less than 20% residual stenosis at this point by palpation there is a continuous thrill noted.    A 4-0 Monocryl purse-string suture was sewn around the sheath.  The sheath was removed and light pressure was applied.  A sterile bandage was applied to the  puncture site.    COMPLICATIONS: None  CONDITION: [name], M.D [facility] Office: [phone]  01/21/2015 4:17 PM

## 2015-01-21 NOTE — H&P (Signed)
[facility] Admission History & Physical  MRN : [id number]  [name] is a 74 y.o. (1940/09/28) female who presents with chief complaint of dialysis access did not work well yesterday.  History of Present Illness: Patient is sent him dialysis for catheter removal. On talking with the patient the had significant difficulties yesterday with her access and it may not be wise to remove her catheter. She states "they were pulling clots". She denies hand or arm pain. She has not been having fever or chills. There is no redness or tenderness surrounding her access. She has not missed dialysis.    Current Facility-Administered Medications  Medication Dose Route Frequency Provider Last Rate Last Dose  . 0.9 %  sodium chloride infusion   Intravenous Continuous Renford Dills, MD 100 mL/hr at 01/21/15 1349    . clindamycin (CLEOCIN) 300 MG/50ML IVPB           . clindamycin (CLEOCIN) IVPB 300 mg  300 mg Intravenous Once Renford Dills, MD        Past Medical History  Diagnosis Date  . Diabetes mellitus without complication   . Chronic kidney disease   . Hypertension   . Stroke     Past Surgical History  Procedure Laterality Date  . Vascular surgery      Social History History  Substance Use Topics  . Smoking status: Former Smoker    Types: Cigarettes    Quit date: 01/20/2013  . Smokeless tobacco: Not on file  . Alcohol Use: No    Family History History reviewed. No pertinent family history. no history of porphyria or autoimmune disorders  Allergies  Allergen Reactions  . Ibuprofen Nausea And Vomiting  . Penicillins Rash  . Sulfa Antibiotics Rash     REVIEW OF SYSTEMS (Negative unless checked)  Constitutional: Weight loss  Fever  Chills Cardiac: Chest pain   Chest pressure   Palpitations   Shortness of breath when laying flat   Shortness of breath at rest   Shortness of breath with exertion. Vascular:  Pain in legs  with walking   Pain in legs at rest   Pain in legs when laying flat   Claudication   Pain in feet when walking  Pain in feet at rest  Pain in feet when laying flat   History of DVT   Phlebitis   Swelling in legs   Varicose veins   Non-healing ulcers Pulmonary:   Uses home oxygen   Productive cough   Hemoptysis   Wheeze  COPD   Asthma Neurologic:  Dizziness  Blackouts   Seizures   History of stroke   History of TIA  Aphasia   Temporary blindness   Dysphagia   Weakness or numbness in arms   Weakness or numbness in legs Musculoskeletal:  Arthritis   Joint swelling   Joint pain   Low back pain Hematologic:  Easy bruising  Easy bleeding   Hypercoagulable state   Anemic  Hepatitis Gastrointestinal:  Blood in stool   Vomiting blood  Gastroesophageal reflux/heartburn   Difficulty swallowing. Genitourinary:  Chronic kidney disease   Difficult urination  Frequent urination  Burning with urination   Blood in urine Skin:  Rashes   Ulcers   Wounds Psychological:  History of anxiety    History of major depression.  Physical Examination  Filed Vitals:   01/21/15 1130  BP: 191/69  Pulse: 63  Temp: 97.8 F (36.6 C)  TempSrc: Oral  Resp:  19  Height: 5\' 9"  (1.753 m)  Weight: 164 lb (74.39 kg)  SpO2: 98%   Body mass index is 24.21 kg/(m^2). Gen: WD/WN, NAD Head: Shaft/AT, No temporalis wasting. Prominent temp pulse not noted. Ear/Nose/Throat: Hearing grossly intact, nares w/o erythema or drainage, oropharynx w/o Erythema/Exudate,  Eyes: PERRLA, EOMI.  Neck: Supple, no nuchal rigidity.  No bruit or JVD.  Pulmonary:  Good air movement, clear to auscultation bilaterally, no use of accessory muscles.  Cardiac: RRR, normal S1, S2, no Murmurs, rubs or gallops. Vascular: AV fistula with good thrill good bruit. There is a 1+ radial pulse. Catheter is intact no drainage at the exit site is  nontender. Gastrointestinal: soft, non-tender/non-distended. No guarding/reflex.  Musculoskeletal: M/S 5/5 throughout.  Extremities without ischemic changes.  No deformity or atrophy.  Neurologic: CN 2-12 intact. Pain and light touch intact in extremities.  Symmetrical.  Speech is fluent. Motor exam as listed above. Psychiatric: Judgment intact, Mood & affect appropriate for pt's clinical situation. Dermatologic: No rashes or ulcers noted.  No cellulitis or open wounds. Lymph : No Cervical, Axillary, or Inguinal lymphadenopathy.    CBC Lab Results  Component Value Date   WBC 7.2 08/19/2014   HGB 8.0* 08/19/2014   HCT 25.4* 08/19/2014   MCV 106* 08/19/2014   PLT 103* 08/19/2014    BMET    Component Value Date/Time   NA 141 08/19/2014 0411   K 5.1 08/19/2014 0411   CL 100 08/19/2014 0411   CO2 32 08/19/2014 0411   GLUCOSE 84 08/19/2014 0411   BUN 72* 08/19/2014 0411   CREATININE 7.86* 08/19/2014 0411   CALCIUM 7.6* 08/19/2014 0411   GFRNONAA 6* 02/26/2014 1510   GFRAA 7* 02/26/2014 1510   CrCl cannot be calculated (Patient has no serum creatinine result on file.).  COAG Lab Results  Component Value Date   INR 1.0 04/12/2014   INR 1.1 02/26/2014    Assessment/Plan 1.  Complication of dialysis device; given the patient's difficulty with dialysis yesterday rather than just remove the catheter and hope for the past he will undergo fistulogram and correction of any abnormality. Should this be adequate then I will proceed with catheter removal. 2.  End-stage renal disease requiring hemodialysis; patient will continue dialysis pending results of the above-noted studies either by her fistula or by her catheter. 3.  Hypertension; patient will continue home meds 4.  Diabetes mellitus; patient will have Accu-Cheks. Peri-Procedure and she will resume her home hypoglycemic medications without changes once she is discharged.   Erian Lariviere, Latina CraverGregory G, MD  01/21/2015 3:28 PM

## 2015-01-21 NOTE — Op Note (Signed)
  OPERATIVE NOTE   PROCEDURE: 1. Removal of a left IJ tunneled dialysis catheter  PRE-OPERATIVE DIAGNOSIS: Complication of dialysis catheter, End stage renal disease  POST-OPERATIVE DIAGNOSIS: Same  SURGEON: Zahrah Sutherlin, Latina CraverGregory G, M.D.  ANESTHESIA: Local anesthetic with 1% lidocaine with epinephrine   ESTIMATED BLOOD LOSS: Minimal   FINDING(S): 1. Catheter intact   SPECIMEN(S):  Catheter  INDICATIONS:   [name] is a 74 y.o. female who presents with functioning left arm access with a nonfunctioning catheter.  The patient has undergone placement of an extremity access which is working and this has been successfully cannulated without difficulty until yesterday at which dialysis session they did have problems. Therefore, fistulogram was performed which demonstrated moderate to severe stenosis at the venous anastomosis and this was treated with balloon angioplasty the graft itself is in excellent condition and perfectly acceptable for dialysis. Therefore she will proceed with removal of her tunneled catheter which is no longer needed to avoid septic complications.   DESCRIPTION: After obtaining full informed written consent, the patient was positioned supine. The left IJ catheter and surrounding area is prepped and draped in a sterile fashion. The cuff was localized by palpation and noted to be less than 3 cm from the exit site. After appropriate timeout is called, 1% lidocaine with epinephrine is infiltrated into the surrounding tissues around the cuff. Small transverse incision is created at the exit site with an 11 blade scalpel and the dissection was carried up along the catheter to expose the cuff of the tunneled catheter.  The catheter cuff is then freed from the surrounding attachments and adhesions. Once the catheter has been freed circumferentially it is removed in 1 piece. Light pressure was held at the base of the neck.   Antibiotic ointment and a sterile dressing is  applied to the exit site. Patient tolerated procedure well and there were no complications.  COMPLICATIONS: None  CONDITION: Unchanged  Sloan Galentine, Latina CraverGregory G, M.D. [facility] Office: [phone]  01/21/2015,4:21 PM

## 2015-01-22 ENCOUNTER — Encounter: Payer: Self-pay | Admitting: Vascular Surgery

## 2015-02-21 IMAGING — CR DG CHEST 2V
1 series · 2 of 2 positions shown · non-contrast
Comparison: None.

CLINICAL DATA: Preop

EXAM:
CHEST  2 VIEW

[Series 1: w chest pa · 0.14mm/px · 2 of 2 slices shown]
[im 1/2]
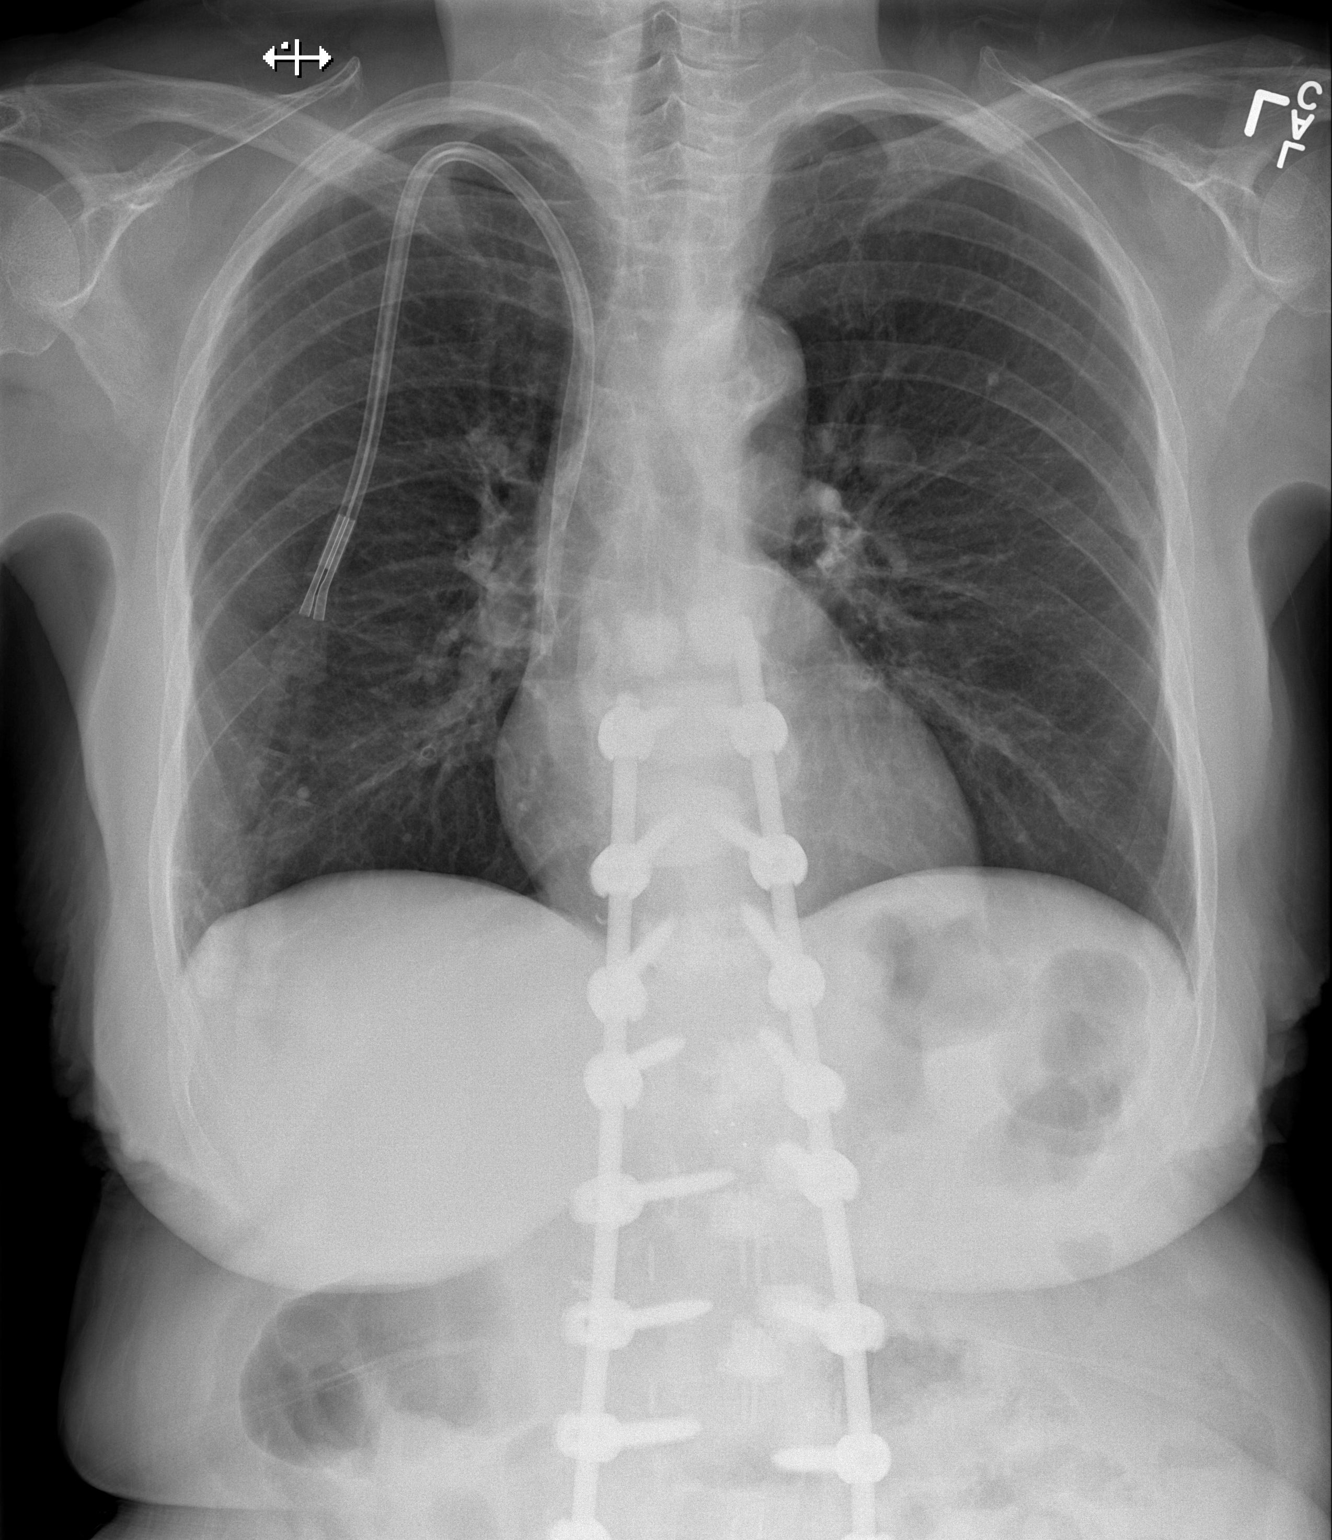
[im 2/2]
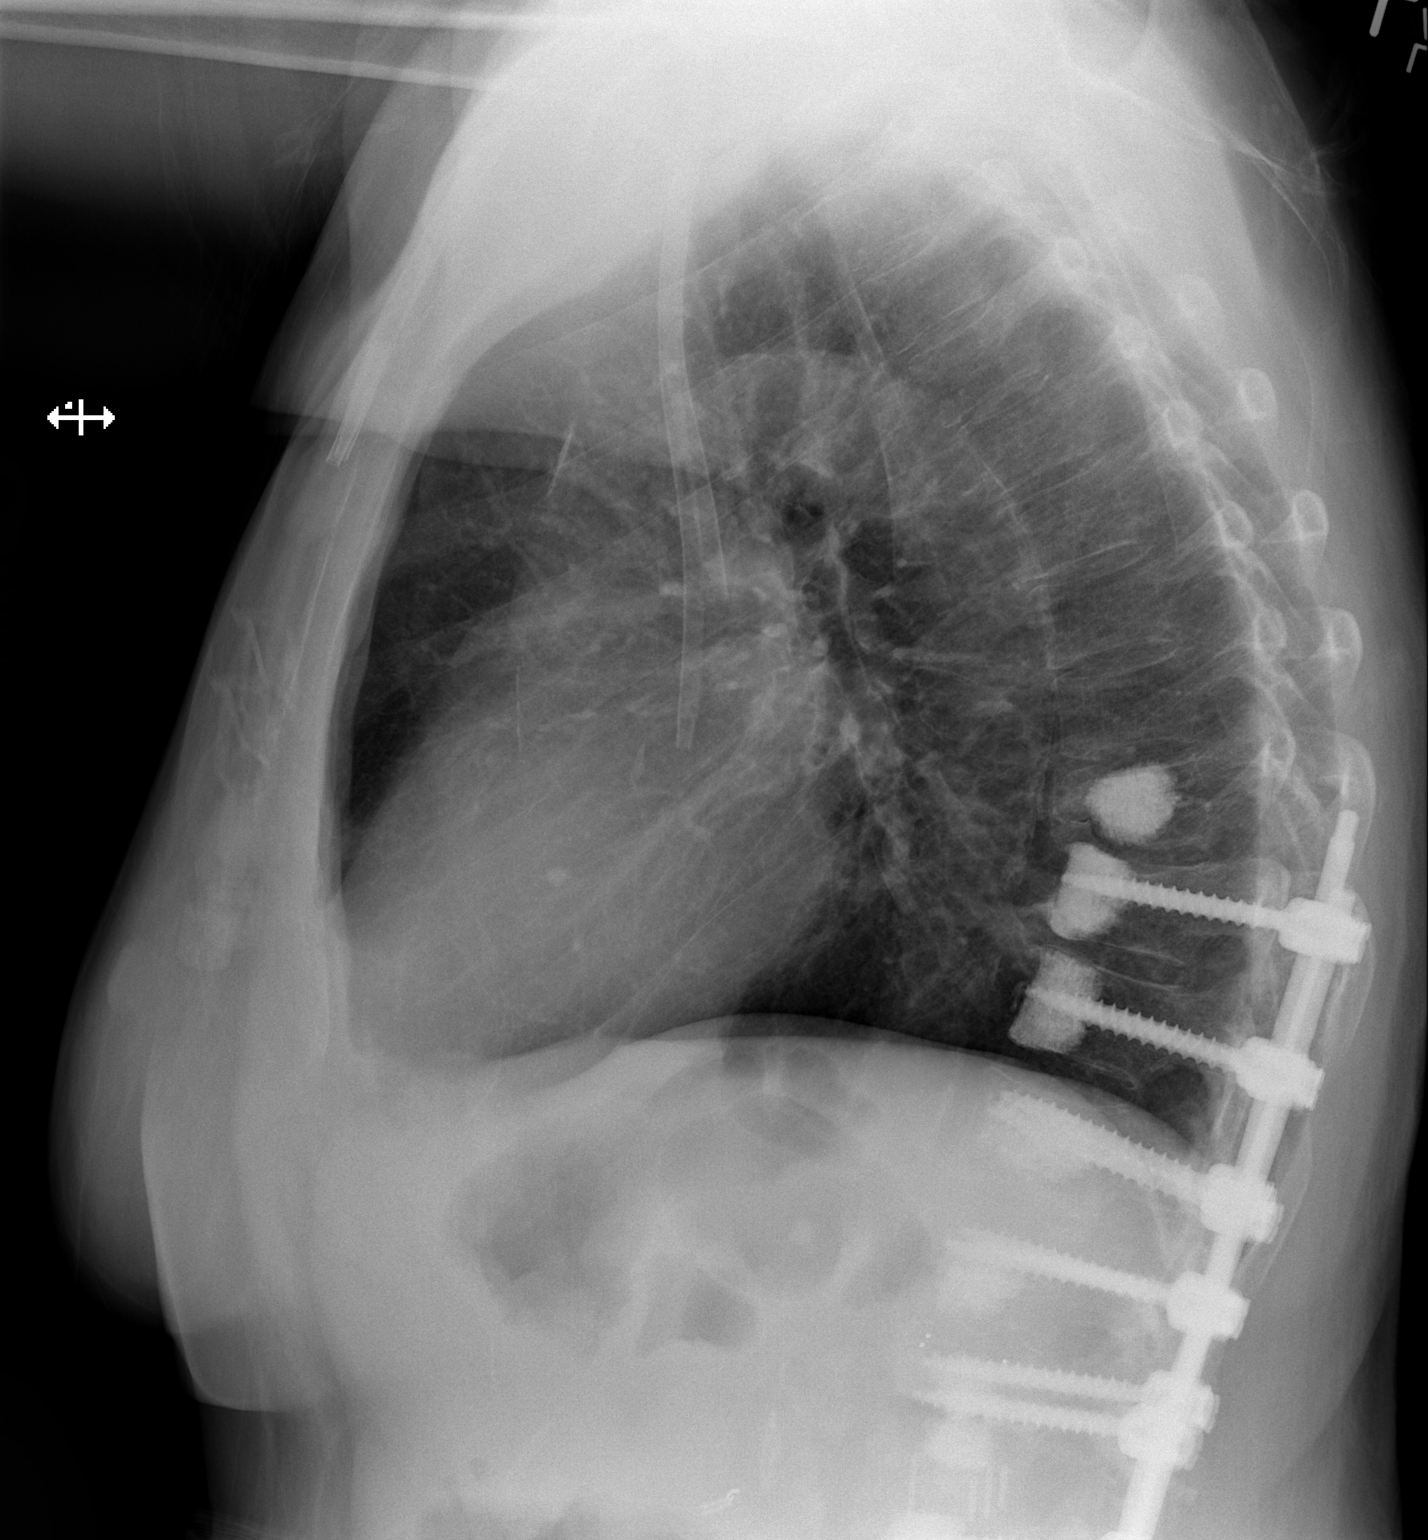

[2 of 2 positions shown; findings below may reference images not displayed]

FINDINGS: Cardiomediastinal silhouette is unremarkable. Metallic fixation rods
noted lower thoracic and lumbar spine. Dual-lumen right IJ dialysis
catheter in place. No acute infiltrate or pleural effusion. No
pulmonary edema. Is
IMPRESSION: No active cardiopulmonary disease.

## 2015-11-14 ENCOUNTER — Other Ambulatory Visit: Payer: Self-pay | Admitting: Vascular Surgery

## 2016-04-20 ENCOUNTER — Encounter (INDEPENDENT_AMBULATORY_CARE_PROVIDER_SITE_OTHER): Payer: Self-pay

## 2016-04-20 ENCOUNTER — Other Ambulatory Visit (INDEPENDENT_AMBULATORY_CARE_PROVIDER_SITE_OTHER): Payer: Self-pay | Admitting: Vascular Surgery

## 2016-04-20 ENCOUNTER — Ambulatory Visit (INDEPENDENT_AMBULATORY_CARE_PROVIDER_SITE_OTHER): Payer: Medicare Other | Admitting: Vascular Surgery

## 2016-04-20 ENCOUNTER — Encounter (INDEPENDENT_AMBULATORY_CARE_PROVIDER_SITE_OTHER): Payer: Self-pay | Admitting: Vascular Surgery

## 2016-04-20 VITALS — BP 153/65 | HR 79 | Resp 16 | Ht 69.0 in | Wt 174.0 lb

## 2016-04-20 DIAGNOSIS — N186 End stage renal disease: Secondary | ICD-10-CM | POA: Diagnosis not present

## 2016-04-20 DIAGNOSIS — T829XXD Unspecified complication of cardiac and vascular prosthetic device, implant and graft, subsequent encounter: Secondary | ICD-10-CM | POA: Diagnosis not present

## 2016-04-20 DIAGNOSIS — E118 Type 2 diabetes mellitus with unspecified complications: Secondary | ICD-10-CM | POA: Insufficient documentation

## 2016-04-20 DIAGNOSIS — Z992 Dependence on renal dialysis: Secondary | ICD-10-CM | POA: Diagnosis not present

## 2016-04-20 DIAGNOSIS — T829XXA Unspecified complication of cardiac and vascular prosthetic device, implant and graft, initial encounter: Secondary | ICD-10-CM | POA: Insufficient documentation

## 2016-04-20 DIAGNOSIS — I10 Essential (primary) hypertension: Secondary | ICD-10-CM | POA: Insufficient documentation

## 2016-04-20 NOTE — Progress Notes (Signed)
Subjective:    Patient ID: [name], female    DOB: January 16, 1941, 75 y.o.   MRN: [id number] Chief Complaint  Patient presents with  . Re-evaluation    Having access problems   Patient last seen in 11/5015. She presents today with a chief complaint of her " access not working right". Patient states she is experiencing prolonged bleeding after treatment, unable to complete a full dialysis treatment without the machine stopping and a recent low flow doppler rate. The patient denies any graft skin breakdown, pain, edema, pallor or ulceration of the arm / hand.   Review of Systems  Constitutional: Negative.   HENT: Negative.   Eyes: Negative.   Respiratory: Negative.   Cardiovascular: Negative.        Dialysis access not working right   Gastrointestinal: Negative.   Endocrine: Negative.   Genitourinary:       ESRD  Musculoskeletal: Negative.   Skin: Negative.   Allergic/Immunologic: Negative.   Neurological: Negative.   Hematological: Negative.   Psychiatric/Behavioral: Negative.       Objective:   Physical Exam  Constitutional: She is oriented to person, place, and time. She appears well-developed and well-nourished.  HENT:  Head: Normocephalic and atraumatic.  Eyes: Conjunctivae and EOM are normal. Pupils are equal, round, and reactive to light.  Neck: Normal range of motion.  Cardiovascular: Normal rate, regular rhythm, normal heart sounds and intact distal pulses.   Pulses:      Radial pulses are 2+ on the right side, and 2+ on the left side.  Left Upper Extremity Graft: Pulsatile in nature.   Pulmonary/Chest: Effort normal and breath sounds normal.  Abdominal: Soft. Bowel sounds are normal.  Musculoskeletal: Normal range of motion.  Neurological: She is alert and oriented to person, place, and time.  Skin: Skin is warm and dry.  No ulceration or breakdown of skin noted along graft.   Psychiatric: She has a normal mood and affect. Her behavior is normal.  Judgment and thought content normal.   BP (!) 153/65 (BP Location: Right Arm)   Pulse 79   Resp 16   Ht 5\' 9"  (1.753 m)   Wt 174 lb (78.9 kg)   BMI 25.70 kg/m   Past Medical History:  Diagnosis Date  . Chronic kidney disease   . Diabetes mellitus without complication (HCC)   . Hypertension   . Stroke [facility](HCC)    Social History   Social History  . Marital status: Widowed    Spouse name: N/A  . Number of children: N/A  . Years of education: N/A   Occupational History  . Not on file.   Social History Main Topics  . Smoking status: Former Smoker    Types: Cigarettes    Quit date: 01/20/2013  . Smokeless tobacco: Never Used  . Alcohol use No  . Drug use: No  . Sexual activity: No   Other Topics Concern  . Not on file   Social History Narrative  . No narrative on file   Past Surgical History:  Procedure Laterality Date  . PERIPHERAL VASCULAR CATHETERIZATION N/A 01/21/2015   Procedure: Dialysis/Perma Catheter Removal;  Surgeon: Renford DillsGregory G Schnier, MD;  Location: [facility] INVASIVE CV LAB;  Service: Cardiovascular;  Laterality: N/A;  . PERIPHERAL VASCULAR CATHETERIZATION Left 01/21/2015   Procedure: A/V Shuntogram/Fistulagram;  Surgeon: Renford DillsGregory G Schnier, MD;  Location: [facility] INVASIVE CV LAB;  Service: Cardiovascular;  Laterality: Left;  Marland Kitchen. VASCULAR SURGERY     Family History  Problem Relation Age of Onset  . Hypertension Father   . Heart disease Father    Allergies  Allergen Reactions  . Nsaids Hives  . Ibuprofen Nausea And Vomiting  . Penicillins Rash  . Sulfa Antibiotics Rash      Assessment & Plan:  Patient last seen in 11/5015. She presents today with a chief complaint of her " access not working right". Patient states she is experiencing prolonged bleeding after treatment, unable to complete a full dialysis treatment without the machine stopping and a recent low flow doppler rate. The patient denies any graft skin breakdown, pain, edema, pallor or ulceration of the arm  / hand.  1. Complication from renal dialysis device, subsequent encounter - New Patient unable to complete a successful dialysis treatment. Recommend LUE shuntogram in an effort to restore function to graft. Procedure, risks and benefits explained to patient. All questions answered. Patient wishes to proceed.   2. ESRD on dialysis (HCC) - Stable Shuntogram scheduled to assess graft and restore function.  3. Type 2 diabetes mellitus with complication, unspecified long term insulin use status (HCC) - Stable Encouraged good control as its slows the progression of atherosclerotic disease  4. Essential hypertension - Stable Encouraged good control as its slows the progression of atherosclerotic disease  Current Outpatient Prescriptions on File Prior to Visit  Medication Sig Dispense Refill  . aspirin (ASPIRIN EC) 81 MG EC tablet Take 81 mg by mouth daily. Swallow whole.    . b complex-vitamin c-folic acid (NEPHRO-VITE) 0.8 MG TABS tablet Take 1 tablet by mouth daily after supper.    . calcium acetate (PHOSLO) 667 MG capsule Take 1,334 mg by mouth 3 (three) times daily with meals.    . cinacalcet (SENSIPAR) 30 MG tablet Take 30 mg by mouth daily with supper.    Marland Kitchen dexlansoprazole (DEXILANT) 60 MG capsule Take 60 mg by mouth daily.    Marland Kitchen diltiazem (CARDIZEM CD) 180 MG 24 hr capsule Take 180 mg by mouth daily.    Marland Kitchen diltiazem (DILACOR XR) 180 MG 24 hr capsule Take 180 mg by mouth daily.    Marland Kitchen dipyridamole-aspirin (AGGRENOX) 200-25 MG per 12 hr capsule Take 1 capsule by mouth 2 (two) times daily.    . DULoxetine (CYMBALTA) 30 MG capsule Take 30 mg by mouth daily.    . ergocalciferol (VITAMIN D2) 50000 UNITS capsule Take 50,000 Units by mouth once a week.    Marland Kitchen HYDROcodone-acetaminophen (NORCO/VICODIN) 5-325 MG per tablet Take 1-2 tablets by mouth every 6 (six) hours as needed for moderate pain.    . Insulin Detemir (LEVEMIR FLEXTOUCH) 100 UNIT/ML Pen Inject 8 Units into the skin daily at 10 pm.    .  levothyroxine (SYNTHROID, LEVOTHROID) 50 MCG tablet Take 50 mcg by mouth daily before breakfast.    . lisinopril (PRINIVIL,ZESTRIL) 20 MG tablet Take 20 mg by mouth 2 (two) times daily.    Marland Kitchen lubiprostone (AMITIZA) 8 MCG capsule Take 8 mcg by mouth 2 (two) times daily with a meal.    . metoprolol succinate (TOPROL-XL) 50 MG 24 hr tablet Take 50 mg by mouth 2 (two) times daily. Take with or immediately following a meal.    . OxyCODONE (OXYCONTIN) 20 mg T12A 12 hr tablet Take 20 mg by mouth every 12 (twelve) hours.    . pantoprazole (PROTONIX) 40 MG tablet Take 40 mg by mouth daily.    . pravastatin (PRAVACHOL) 40 MG tablet Take 40 mg by mouth daily.  No current facility-administered medications on file prior to visit.     There are no Patient Instructions on file for this visit. No Follow-up on file.   Ronnita Paz A Ilia Engelbert, PA-C

## 2016-04-27 ENCOUNTER — Encounter
Admission: RE | Disposition: A | Discharge status: Home / Self Care | Payer: Self-pay | Source: Ambulatory Visit | Attending: Vascular Surgery

## 2016-04-27 ENCOUNTER — Ambulatory Visit
Admission: RE | Admit: 2016-04-27 | Discharge: 2016-04-27 | Disposition: A | Discharge status: Home / Self Care | Payer: Medicare Other | Source: Ambulatory Visit | Attending: Vascular Surgery | Admitting: Vascular Surgery

## 2016-04-27 DIAGNOSIS — I519 Heart disease, unspecified: Secondary | ICD-10-CM | POA: Diagnosis not present

## 2016-04-27 DIAGNOSIS — Z880 Allergy status to penicillin: Secondary | ICD-10-CM | POA: Insufficient documentation

## 2016-04-27 DIAGNOSIS — Z992 Dependence on renal dialysis: Secondary | ICD-10-CM | POA: Diagnosis not present

## 2016-04-27 DIAGNOSIS — Z882 Allergy status to sulfonamides status: Secondary | ICD-10-CM | POA: Insufficient documentation

## 2016-04-27 DIAGNOSIS — I132 Hypertensive heart and chronic kidney disease with heart failure and with stage 5 chronic kidney disease, or end stage renal disease: Secondary | ICD-10-CM | POA: Insufficient documentation

## 2016-04-27 DIAGNOSIS — Z8249 Family history of ischemic heart disease and other diseases of the circulatory system: Secondary | ICD-10-CM | POA: Insufficient documentation

## 2016-04-27 DIAGNOSIS — N186 End stage renal disease: Secondary | ICD-10-CM | POA: Diagnosis not present

## 2016-04-27 DIAGNOSIS — T82858A Stenosis of vascular prosthetic devices, implants and grafts, initial encounter: Secondary | ICD-10-CM | POA: Diagnosis not present

## 2016-04-27 DIAGNOSIS — T82838A Hemorrhage of vascular prosthetic devices, implants and grafts, initial encounter: Secondary | ICD-10-CM | POA: Diagnosis present

## 2016-04-27 DIAGNOSIS — E1122 Type 2 diabetes mellitus with diabetic chronic kidney disease: Secondary | ICD-10-CM | POA: Diagnosis not present

## 2016-04-27 DIAGNOSIS — Z886 Allergy status to analgesic agent status: Secondary | ICD-10-CM | POA: Diagnosis not present

## 2016-04-27 DIAGNOSIS — Z794 Long term (current) use of insulin: Secondary | ICD-10-CM | POA: Insufficient documentation

## 2016-04-27 DIAGNOSIS — Y834 Other reconstructive surgery as the cause of abnormal reaction of the patient, or of later complication, without mention of misadventure at the time of the procedure: Secondary | ICD-10-CM | POA: Diagnosis not present

## 2016-04-27 DIAGNOSIS — Z87891 Personal history of nicotine dependence: Secondary | ICD-10-CM | POA: Diagnosis not present

## 2016-04-27 HISTORY — PX: PERIPHERAL VASCULAR CATHETERIZATION: SHX172C

## 2016-04-27 LAB — POTASSIUM (ARMC VASCULAR LAB ONLY): POTASSIUM (ARMC VASCULAR LAB): 5.5 — AB (ref 3.5–5.1)

## 2016-04-27 SURGERY — A/V SHUNTOGRAM/FISTULAGRAM
Anesthesia: Moderate Sedation

## 2016-04-27 MED ORDER — FENTANYL CITRATE (PF) 100 MCG/2ML IJ SOLN
INTRAMUSCULAR | Status: AC
  Filled 2016-04-27: qty 2

## 2016-04-27 MED ORDER — HEPARIN SODIUM (PORCINE) 1000 UNIT/ML IJ SOLN
INTRAMUSCULAR | Status: AC
  Filled 2016-04-27: qty 1

## 2016-04-27 MED ORDER — HYDROMORPHONE HCL 1 MG/ML IJ SOLN: 1.0000 mg | Freq: Once | INTRAMUSCULAR | Status: DC

## 2016-04-27 MED ORDER — ONDANSETRON HCL 4 MG/2ML IJ SOLN: 4.0000 mg | Freq: Four times a day (QID) | INTRAMUSCULAR | Status: DC | PRN

## 2016-04-27 MED ORDER — CLINDAMYCIN PHOSPHATE 300 MG/50ML IV SOLN
INTRAVENOUS | Status: AC
  Administered 2016-04-27: 300 mg via INTRAVENOUS
  Filled 2016-04-27: qty 50

## 2016-04-27 MED ORDER — HYDRALAZINE HCL 20 MG/ML IJ SOLN
INTRAMUSCULAR | Status: AC
  Administered 2016-04-27: 20 mg
  Filled 2016-04-27: qty 1

## 2016-04-27 MED ORDER — FAMOTIDINE 20 MG PO TABS: 40.0000 mg | ORAL_TABLET | ORAL | Status: DC | PRN

## 2016-04-27 MED ORDER — MIDAZOLAM HCL 2 MG/2ML IJ SOLN
INTRAMUSCULAR | Status: DC | PRN
  Administered 2016-04-27 (×2): 1 mg via INTRAVENOUS

## 2016-04-27 MED ORDER — IOPAMIDOL (ISOVUE-300) INJECTION 61%
INTRAVENOUS | Status: DC | PRN
  Administered 2016-04-27: 35 mL via INTRA_ARTERIAL

## 2016-04-27 MED ORDER — HYDRALAZINE HCL 20 MG/ML IJ SOLN: 20.0000 mg | Freq: Once | INTRAMUSCULAR | Status: DC

## 2016-04-27 MED ORDER — MIDAZOLAM HCL 5 MG/5ML IJ SOLN
INTRAMUSCULAR | Status: AC
  Filled 2016-04-27: qty 5

## 2016-04-27 MED ORDER — FENTANYL CITRATE (PF) 100 MCG/2ML IJ SOLN
INTRAMUSCULAR | Status: DC | PRN
  Administered 2016-04-27 (×2): 50 ug via INTRAVENOUS

## 2016-04-27 MED ORDER — CLINDAMYCIN PHOSPHATE 300 MG/50ML IV SOLN
300.0000 mg | Freq: Once | INTRAVENOUS | Status: AC
  Administered 2016-04-27: 300 mg via INTRAVENOUS

## 2016-04-27 MED ORDER — HEPARIN (PORCINE) IN NACL 2-0.9 UNIT/ML-% IJ SOLN
INTRAMUSCULAR | Status: AC
  Filled 2016-04-27: qty 1000

## 2016-04-27 MED ORDER — LIDOCAINE HCL (PF) 1 % IJ SOLN
INTRAMUSCULAR | Status: AC
  Filled 2016-04-27: qty 30

## 2016-04-27 MED ORDER — METHYLPREDNISOLONE SODIUM SUCC 125 MG IJ SOLR: 125.0000 mg | INTRAMUSCULAR | Status: DC | PRN

## 2016-04-27 MED ORDER — SODIUM CHLORIDE 0.9 % IV SOLN
INTRAVENOUS | Status: DC
  Administered 2016-04-27: 10:00:00 via INTRAVENOUS

## 2016-04-27 SURGICAL SUPPLY — 10 items
CATH TORCON 5FR 0.38 (CATHETERS) ×3 IMPLANT
DEVICE TORQUE (MISCELLANEOUS) ×3 IMPLANT
DRAPE BRACHIAL (DRAPES) ×3 IMPLANT
GUIDEWIRE ANGLED .035 180CM (WIRE) ×3 IMPLANT
NEEDLE ENTRY 21GA 7CM ECHOTIP (NEEDLE) ×3 IMPLANT
NEEDLE PERC 18GX7CM (NEEDLE) ×3 IMPLANT
PACK ANGIOGRAPHY (CUSTOM PROCEDURE TRAY) ×3 IMPLANT
SET INTRO CAPELLA COAXIAL (SET/KITS/TRAYS/PACK) ×3 IMPLANT
SHEATH BRITE TIP 6FRX5.5 (SHEATH) ×3 IMPLANT
TOWEL OR 17X26 4PK STRL BLUE (TOWEL DISPOSABLE) ×3 IMPLANT

## 2016-04-27 NOTE — H&P (Signed)
[facility] History & Physical Update  The patient was interviewed and re-examined.  The patient's previous History and Physical has been reviewed and is unchanged.  There is no change in the plan of care. We plan to proceed with the scheduled procedure.  Levora DredgeGregory Schnier, MD  04/27/2016, 10:40 AM

## 2016-04-27 NOTE — Op Note (Signed)
[facility]  Percutaneous Study/Intervention Procedural Note   Date of Surgery: 04/27/2016,11:37 AM  Surgeon:Najmah Carradine, Latina CraverGregory Jacobson   Pre-operative Diagnosis: Complication of dialysis access; and stage renal disease  Post-operative diagnosis:  Same  Procedure(s) Performed:  1.  Contrast injection left arm brachial axillary dialysis graft  2.  Ultrasound-guided placement of sheath left arm brachial axillary dialysis graft    Anesthesia: Conscious sedation was administered under my direct supervision. IV Versed plus fentanyl were utilized. Continuous ECG, pulse oximetry and blood pressure was monitored throughout the entire procedure.  Conscious sedation was administered for a total of 35 minutes.  Sheath: 6 [place]  Contrast: 35 cc   Fluoroscopy Time: 8.2 minutes  Indications:  Patient is sent by her dialysis center and the supervising nephrologist at that center secondary to prolonged bleeding after decannulation  Procedure:  [name] is a 75 y.o. female who was identified and appropriate procedural time out was performed.  The patient was then placed supine on the table and prepped and draped in the usual sterile fashion.  Ultrasound was used to evaluate the left arm brachial axillary dialysis graft. The graft is echolucent it is compressible indicating patency. Images recorded for the permanent record. 1% lidocaine is infiltrated in soft tissues and under direct ultrasound visualization a microneedle is inserted. Microwire is then advanced without difficulty followed by the micro-sheath J-wire followed by 6 [place] sheath.  Hand injection contrast is then performed through the sheath including a reflux imaging of the arterial anastomosis as well as the central venous anatomy.  After review these images pursestring suture of 4-0 Monocryl was placed around the sheath and the sheath is removed.  Interpretation:  The AV graft itself is in fairly good  condition arterial anastomosis is widely patent and the visualized portions of the brachial artery widely patent. Central venous anatomy is also patent. However, at the venous anastomosis there is an occlusion of the axillary vein from the level of the anastomosis proximally extending for several centimeters. There is reconstitution of the axillary vein by several collaterals and as noted above the remaining central venous anatomy is widely patent.  Summary: This is not a situation amenable to intervention and therefore open revision of the venous anastomotic area will be discussed with the patient.  Disposition: Patient was taken to the recovery room in stable condition having tolerated the procedure well.  Heather LitesGregory Halie Gass 04/27/2016,11:37 AM

## 2016-04-27 NOTE — Discharge Instructions (Signed)
Fistulogram, Care After °Refer to this sheet in the next few weeks. These instructions provide you with information on caring for yourself after your procedure. Your health care provider may also give you more specific instructions. Your treatment has been planned according to current medical practices, but problems sometimes occur. Call your health care provider if you have any problems or questions after your procedure. °WHAT TO EXPECT AFTER THE PROCEDURE °After your procedure, it is typical to have the following: °· A small amount of discomfort in the area where the catheters were placed. °· A small amount of bruising around the fistula. °· Sleepiness and fatigue. °HOME CARE INSTRUCTIONS °· Rest at home for the day following your procedure. °· Do not drive or operate heavy machinery while taking pain medicine. °· Take medicines only as directed by your health care provider. °· Do not take baths, swim, or use a hot tub until your health care provider approves. You may shower 24 hours after the procedure or as directed by your health care provider. °· There are many different ways to close and cover an incision, including stitches, skin glue, and adhesive strips. Follow your health care provider's instructions on: °¨ Incision care. °¨ Bandage (dressing) changes and removal. °¨ Incision closure removal. °· Monitor your dialysis fistula carefully. °SEEK MEDICAL CARE IF: °· You have drainage, redness, swelling, or pain at your catheter site. °· You have a fever. °· You have chills. °SEEK IMMEDIATE MEDICAL CARE IF: °· You feel weak. °· You have trouble balancing. °· You have trouble moving your arms or legs. °· You have problems with your speech or vision. °· You can no longer feel a vibration or buzz when you put your fingers over your dialysis fistula. °· The limb that was used for the procedure: °¨ Swells. °¨ Is painful. °¨ Is cold. °¨ Is discolored, such as blue or pale white. °  °This information is not intended  to replace advice given to you by your health care provider. Make sure you discuss any questions you have with your health care provider. °  °Document Released: 11/12/2013 Document Reviewed: 11/12/2013 °Elsevier Interactive Patient Education ©2016 [employer] ° °

## 2016-04-28 ENCOUNTER — Encounter: Payer: Self-pay | Admitting: Vascular Surgery

## 2016-05-10 ENCOUNTER — Encounter (INDEPENDENT_AMBULATORY_CARE_PROVIDER_SITE_OTHER): Payer: Medicare Other | Admitting: Vascular Surgery

## 2016-05-13 ENCOUNTER — Ambulatory Visit (INDEPENDENT_AMBULATORY_CARE_PROVIDER_SITE_OTHER): Payer: Medicare Other | Admitting: Vascular Surgery

## 2016-05-13 ENCOUNTER — Encounter (INDEPENDENT_AMBULATORY_CARE_PROVIDER_SITE_OTHER): Payer: Self-pay | Admitting: Vascular Surgery

## 2016-05-13 VITALS — BP 198/77 | HR 71 | Resp 16 | Ht 69.0 in | Wt 180.0 lb

## 2016-05-13 DIAGNOSIS — E118 Type 2 diabetes mellitus with unspecified complications: Secondary | ICD-10-CM | POA: Diagnosis not present

## 2016-05-13 DIAGNOSIS — T829XXS Unspecified complication of cardiac and vascular prosthetic device, implant and graft, sequela: Secondary | ICD-10-CM

## 2016-05-13 DIAGNOSIS — N186 End stage renal disease: Secondary | ICD-10-CM

## 2016-05-13 DIAGNOSIS — Z992 Dependence on renal dialysis: Secondary | ICD-10-CM

## 2016-05-13 DIAGNOSIS — I10 Essential (primary) hypertension: Secondary | ICD-10-CM

## 2016-05-23 NOTE — Progress Notes (Signed)
[facility] Admission History & Physical  MRN : [id number]  [name] is a 75 y.o. ([birth year]) female who presents with chief complaint of  Chief Complaint  Patient presents with  . Follow-up  .  History of Present Illness: The patient returns to the office for followup of their dialysis access. The function of the access has been stable. The patient denies increased bleeding time or increased recirculation. Patient denies difficulty with cannulation. The patient denies hand pain or other symptoms consistent with steal phenomena.  No significant arm swelling.  The patient denies redness or swelling at the access site. The patient denies fever or chills at home or while on dialysis.  The patient denies amaurosis fugax or recent TIA symptoms. There are no recent neurological changes noted. The patient denies claudication symptoms or rest pain symptoms. The patient denies history of DVT, PE or superficial thrombophlebitis. The patient denies recent episodes of angina or shortness of breath.       Current Meds  Medication Sig  . aspirin (ASPIRIN EC) 81 MG EC tablet Take 81 mg by mouth daily. Swallow whole.  . b complex-vitamin c-folic acid (NEPHRO-VITE) 0.8 MG TABS tablet Take 1 tablet by mouth daily after supper.  . calcium acetate (PHOSLO) 667 MG capsule Take 1,334 mg by mouth 3 (three) times daily with meals.  . carvedilol (COREG) 12.5 MG tablet 2 (two) times daily.  . cinacalcet (SENSIPAR) 30 MG tablet Take 30 mg by mouth daily with supper.  Marland Kitchen dexlansoprazole (DEXILANT) 60 MG capsule Take 60 mg by mouth daily.  Marland Kitchen diltiazem (CARDIZEM CD) 180 MG 24 hr capsule Take 180 mg by mouth daily.  Marland Kitchen diltiazem (DILACOR XR) 180 MG 24 hr capsule Take 180 mg by mouth daily.  . DULoxetine (CYMBALTA) 30 MG capsule Take 30 mg by mouth daily.  . ergocalciferol (VITAMIN D2) 50000 UNITS capsule Take 50,000 Units by mouth once a week.  . hydrALAZINE (APRESOLINE) 50 MG  tablet 3 (three) times daily.  . Insulin Detemir (LEVEMIR FLEXTOUCH) 100 UNIT/ML Pen Inject 8 Units into the skin daily at 10 pm.  . levothyroxine (SYNTHROID, LEVOTHROID) 50 MCG tablet Take 50 mcg by mouth daily before breakfast.  . lisinopril (PRINIVIL,ZESTRIL) 20 MG tablet Take 20 mg by mouth 2 (two) times daily.  . traMADol (ULTRAM) 50 MG tablet as needed.    Past Medical History:  Diagnosis Date  . Chronic kidney disease   . Diabetes mellitus without complication (HCC)   . Hypertension   . Stroke [facility])     Past Surgical History:  Procedure Laterality Date  . PERIPHERAL VASCULAR CATHETERIZATION N/A 01/21/2015   Procedure: Dialysis/Perma Catheter Removal;  Surgeon: Renford Dills, MD;  Location: [facility] INVASIVE CV LAB;  Service: Cardiovascular;  Laterality: N/A;  . PERIPHERAL VASCULAR CATHETERIZATION Left 01/21/2015   Procedure: A/V Shuntogram/Fistulagram;  Surgeon: Renford Dills, MD;  Location: [facility] INVASIVE CV LAB;  Service: Cardiovascular;  Laterality: Left;  . PERIPHERAL VASCULAR CATHETERIZATION Left 04/27/2016   Procedure: A/V Shuntogram/Fistulagram;  Surgeon: Renford Dills, MD;  Location: [facility] INVASIVE CV LAB;  Service: Cardiovascular;  Laterality: Left;  . PERIPHERAL VASCULAR CATHETERIZATION N/A 04/27/2016   Procedure: A/V Shunt Intervention;  Surgeon: Renford Dills, MD;  Location: [facility] INVASIVE CV LAB;  Service: Cardiovascular;  Laterality: N/A;  . VASCULAR SURGERY      Social History Social History  Substance Use Topics  . Smoking status: Former Smoker    Types: Cigarettes  Quit date: 01/20/2013  . Smokeless tobacco: Never Used  . Alcohol use No    Family History Family History  Problem Relation Age of Onset  . Hypertension Father   . Heart disease Father     Allergies  Allergen Reactions  . Nsaids Hives  . Ibuprofen Nausea And Vomiting  . Penicillins Rash  . Sulfa Antibiotics Rash     REVIEW OF SYSTEMS (Negative unless  checked)  Constitutional: [] Weight loss  [] Fever  [] Chills Cardiac: [] Chest pain   [] Chest pressure   [] Palpitations   [] Shortness of breath when laying flat   [] Shortness of breath with exertion. Vascular:  [] Pain in legs with walking   [] Pain in legs at rest  [] History of DVT   [] Phlebitis   [] Swelling in legs   [] Varicose veins   [] Non-healing ulcers Pulmonary:   [] Uses home oxygen   [] Productive cough   [] Hemoptysis   [] Wheeze  [] COPD   [] Asthma Neurologic:  [] Dizziness   [] Seizures   [] History of stroke   [] History of TIA  [] Aphasia   [] Vissual changes   [] Weakness or numbness in arm   [] Weakness or numbness in leg Musculoskeletal:   [] Joint swelling   [] Joint pain   [] Low back pain Hematologic:  [] Easy bruising  [] Easy bleeding   [] Hypercoagulable state   [] Anemic Gastrointestinal:  [] Diarrhea   [] Vomiting  [] Gastroesophageal reflux/heartburn   [] Difficulty swallowing. Genitourinary:  [x] Chronic kidney disease   [] Difficult urination  [] Frequent urination   [] Blood in urine Skin:  [] Rashes   [] Ulcers  Psychological:  [] History of anxiety   []  History of major depression.  Physical Examination  Vitals:   05/13/16 1146  BP: (!) 198/77  Pulse: 71  Resp: 16  Weight: 180 lb (81.6 kg)  Height: 5\' 9"  (1.753 m)   Body mass index is 26.58 kg/m. Gen: WD/WN, NAD Head: [place]/AT, No temporalis wasting.  Ear/Nose/Throat: Hearing grossly intact, nares w/o erythema or drainage, poor dentition Eyes: PER, EOMI, sclera nonicteric.  Neck: Supple, no masses.  No bruit or JVD.  Pulmonary:  Good air movement, clear to auscultation bilaterally, no use of accessory muscles.  Cardiac: RRR, normal S1, S2, no Murmurs. Vascular:  AV access good thrill good bruit Vessel Right Left  Radial Palpable Palpable  Ulnar Palpable Palpable  Brachial Palpable Palpable  Carotid Palpable Palpable  Femoral Palpable Palpable  Popliteal Palpable Palpable  PT Palpable Palpable  DP Palpable Palpable    Gastrointestinal: soft, non-distended. No guarding/no peritoneal signs.  Musculoskeletal: M/S 5/5 throughout.  No deformity or atrophy.  Neurologic: CN 2-12 intact. Pain and light touch intact in extremities.  Symmetrical.  Speech is fluent. Motor exam as listed above. Psychiatric: Judgment intact, Mood & affect appropriate for pt's clinical situation. Dermatologic: No rashes or ulcers noted.  No changes consistent with cellulitis. Lymph : No Cervical lymphadenopathy, no lichenification or skin changes of chronic lymphedema.  CBC Lab Results  Component Value Date   WBC 7.2 08/19/2014   HGB 8.0 (L) 08/19/2014   HCT 25.4 (L) 08/19/2014   MCV 106 (H) 08/19/2014   PLT 103 (L) 08/19/2014    BMET    Component Value Date/Time   NA 141 08/19/2014 0411   K 5.1 08/19/2014 0411   CL 100 08/19/2014 0411   CO2 32 08/19/2014 0411   GLUCOSE 84 08/19/2014 0411   BUN 72 (H) 08/19/2014 0411   CREATININE 7.86 (H) 08/19/2014 0411   CALCIUM 7.6 (L) 08/19/2014 0411   GFRNONAA 5 (L) 08/19/2014 0411  GFRNONAA 6 (L) 02/26/2014 1510   GFRAA 6 (L) 08/19/2014 0411   GFRAA 7 (L) 02/26/2014 1510   CrCl cannot be calculated (Patient's most recent lab result is older than the maximum 21 days allowed.).  COAG Lab Results  Component Value Date   INR 1.0 04/12/2014   INR 1.1 02/26/2014    Radiology No results found.   Assessment/Plan 1. Complication from renal dialysis device, sequela Recommend:  The patient is doing well and currently has adequate dialysis access. The patient's dialysis center is not reporting any access issues. Flow pattern is stable when compared to the prior ultrasound.  The patient should have a duplex ultrasound of the dialysis access in 3 months. The patient will follow-up with me in the office after each ultrasound   2. ESRD on dialysis [facility](HCC) Recommend:  The patient is doing well and currently has adequate dialysis access. The patient's dialysis center is not  reporting any access issues. Flow pattern is stable when compared to the prior ultrasound.  The patient should have a duplex ultrasound of the dialysis access in 3 months. The patient will follow-up with me in the office after each ultrasound   3. Type 2 diabetes mellitus with complication, unspecified long term insulin use status (HCC) Continue hypoglycemic medications as already ordered and reviewed, no changes at this time.  4. Essential hypertension Continue antihypertensive medications as already ordered and reviewed, no changes at this time.  Continue statin as ordered and reviewed, no changes at this time  Levora DredgeGregory Schnier, MD  05/23/2016 7:54 PM

## 2016-07-21 ENCOUNTER — Encounter (INDEPENDENT_AMBULATORY_CARE_PROVIDER_SITE_OTHER): Payer: Self-pay

## 2016-07-22 ENCOUNTER — Other Ambulatory Visit (INDEPENDENT_AMBULATORY_CARE_PROVIDER_SITE_OTHER): Payer: Self-pay | Admitting: Vascular Surgery

## 2016-07-27 ENCOUNTER — Encounter
Admission: RE | Disposition: A | Discharge status: Home / Self Care | Payer: Self-pay | Source: Ambulatory Visit | Attending: Vascular Surgery

## 2016-07-27 ENCOUNTER — Ambulatory Visit
Admission: RE | Admit: 2016-07-27 | Discharge: 2016-07-27 | Disposition: A | Discharge status: Home / Self Care | Payer: Medicare Other | Source: Ambulatory Visit | Attending: Vascular Surgery | Admitting: Vascular Surgery

## 2016-07-27 DIAGNOSIS — Z880 Allergy status to penicillin: Secondary | ICD-10-CM | POA: Diagnosis not present

## 2016-07-27 DIAGNOSIS — Z87891 Personal history of nicotine dependence: Secondary | ICD-10-CM | POA: Diagnosis not present

## 2016-07-27 DIAGNOSIS — E1122 Type 2 diabetes mellitus with diabetic chronic kidney disease: Secondary | ICD-10-CM | POA: Insufficient documentation

## 2016-07-27 DIAGNOSIS — I2510 Atherosclerotic heart disease of native coronary artery without angina pectoris: Secondary | ICD-10-CM | POA: Insufficient documentation

## 2016-07-27 DIAGNOSIS — Y832 Surgical operation with anastomosis, bypass or graft as the cause of abnormal reaction of the patient, or of later complication, without mention of misadventure at the time of the procedure: Secondary | ICD-10-CM | POA: Diagnosis not present

## 2016-07-27 DIAGNOSIS — Z992 Dependence on renal dialysis: Secondary | ICD-10-CM | POA: Insufficient documentation

## 2016-07-27 DIAGNOSIS — E119 Type 2 diabetes mellitus without complications: Secondary | ICD-10-CM | POA: Diagnosis not present

## 2016-07-27 DIAGNOSIS — T82868A Thrombosis of vascular prosthetic devices, implants and grafts, initial encounter: Secondary | ICD-10-CM | POA: Diagnosis not present

## 2016-07-27 DIAGNOSIS — N186 End stage renal disease: Secondary | ICD-10-CM | POA: Insufficient documentation

## 2016-07-27 DIAGNOSIS — I10 Essential (primary) hypertension: Secondary | ICD-10-CM | POA: Diagnosis not present

## 2016-07-27 DIAGNOSIS — Z8673 Personal history of transient ischemic attack (TIA), and cerebral infarction without residual deficits: Secondary | ICD-10-CM | POA: Diagnosis not present

## 2016-07-27 DIAGNOSIS — T82858A Stenosis of vascular prosthetic devices, implants and grafts, initial encounter: Secondary | ICD-10-CM | POA: Insufficient documentation

## 2016-07-27 DIAGNOSIS — I1311 Hypertensive heart and chronic kidney disease without heart failure, with stage 5 chronic kidney disease, or end stage renal disease: Secondary | ICD-10-CM | POA: Diagnosis not present

## 2016-07-27 HISTORY — PX: PERIPHERAL VASCULAR CATHETERIZATION: SHX172C

## 2016-07-27 LAB — GLUCOSE, CAPILLARY
GLUCOSE-CAPILLARY: 69 mg/dL (ref 65–99)
GLUCOSE-CAPILLARY: 124 mg/dL — AB (ref 65–99)

## 2016-07-27 LAB — POTASSIUM (ARMC VASCULAR LAB ONLY): POTASSIUM (ARMC VASCULAR LAB): 4.9 (ref 3.5–5.1)

## 2016-07-27 SURGERY — A/V SHUNTOGRAM
Anesthesia: Moderate Sedation | Laterality: Left

## 2016-07-27 MED ORDER — FAMOTIDINE 20 MG PO TABS: 40.0000 mg | ORAL_TABLET | ORAL | Status: DC | PRN

## 2016-07-27 MED ORDER — CLINDAMYCIN PHOSPHATE 300 MG/50ML IV SOLN
INTRAVENOUS | Status: AC
  Administered 2016-07-27: 300 mg via INTRAVENOUS
  Filled 2016-07-27: qty 50

## 2016-07-27 MED ORDER — SODIUM CHLORIDE 0.9 % IV SOLN
INTRAVENOUS | Status: DC
  Administered 2016-07-27: 14:00:00 via INTRAVENOUS

## 2016-07-27 MED ORDER — IOPAMIDOL (ISOVUE-300) INJECTION 61%
INTRAVENOUS | Status: DC | PRN
  Administered 2016-07-27: 60 mL via INTRA_ARTERIAL

## 2016-07-27 MED ORDER — HEPARIN (PORCINE) IN NACL 2-0.9 UNIT/ML-% IJ SOLN
INTRAMUSCULAR | Status: AC
  Filled 2016-07-27: qty 1000

## 2016-07-27 MED ORDER — LIDOCAINE HCL (PF) 1 % IJ SOLN
INTRAMUSCULAR | Status: AC
  Filled 2016-07-27: qty 30

## 2016-07-27 MED ORDER — LABETALOL HCL 5 MG/ML IV SOLN
INTRAVENOUS | Status: DC | PRN
  Administered 2016-07-27: 10 mg via INTRAVENOUS

## 2016-07-27 MED ORDER — HEPARIN SODIUM (PORCINE) 1000 UNIT/ML IJ SOLN
INTRAMUSCULAR | Status: DC | PRN
  Administered 2016-07-27: 1000 [IU] via INTRAVENOUS
  Administered 2016-07-27: 3000 [IU] via INTRAVENOUS

## 2016-07-27 MED ORDER — HYDROMORPHONE HCL 1 MG/ML IJ SOLN: 1.0000 mg | Freq: Once | INTRAMUSCULAR | Status: DC

## 2016-07-27 MED ORDER — CLINDAMYCIN PHOSPHATE 300 MG/50ML IV SOLN
300.0000 mg | Freq: Once | INTRAVENOUS | Status: AC
  Administered 2016-07-27: 300 mg via INTRAVENOUS

## 2016-07-27 MED ORDER — MIDAZOLAM HCL 5 MG/5ML IJ SOLN
INTRAMUSCULAR | Status: AC
  Filled 2016-07-27: qty 5

## 2016-07-27 MED ORDER — FENTANYL CITRATE (PF) 100 MCG/2ML IJ SOLN
INTRAMUSCULAR | Status: AC
Start: 2016-07-27 — End: 2016-07-27
  Filled 2016-07-27: qty 2

## 2016-07-27 MED ORDER — FENTANYL CITRATE (PF) 100 MCG/2ML IJ SOLN
INTRAMUSCULAR | Status: DC | PRN
  Administered 2016-07-27 (×4): 50 ug via INTRAVENOUS

## 2016-07-27 MED ORDER — ONDANSETRON HCL 4 MG/2ML IJ SOLN: 4.0000 mg | Freq: Four times a day (QID) | INTRAMUSCULAR | Status: DC | PRN

## 2016-07-27 MED ORDER — LABETALOL HCL 5 MG/ML IV SOLN
INTRAVENOUS | Status: AC
  Filled 2016-07-27: qty 4

## 2016-07-27 MED ORDER — METHYLPREDNISOLONE SODIUM SUCC 125 MG IJ SOLR: 125.0000 mg | INTRAMUSCULAR | Status: DC | PRN

## 2016-07-27 MED ORDER — NITROGLYCERIN 1 MG/10 ML FOR IR/CATH LAB
INTRA_ARTERIAL | Status: DC | PRN
  Administered 2016-07-27 (×2): 500 ug

## 2016-07-27 MED ORDER — DEXTROSE 50 % IV SOLN
INTRAVENOUS | Status: AC
  Filled 2016-07-27: qty 50

## 2016-07-27 MED ORDER — MIDAZOLAM HCL 2 MG/2ML IJ SOLN
INTRAMUSCULAR | Status: DC | PRN
  Administered 2016-07-27: 1 mg via INTRAVENOUS
  Administered 2016-07-27: 2 mg via INTRAVENOUS
  Administered 2016-07-27 (×2): 1 mg via INTRAVENOUS

## 2016-07-27 MED ORDER — DEXTROSE 50 % IV SOLN
25.0000 mL | Freq: Once | INTRAVENOUS | Status: AC
  Administered 2016-07-27: 25 mL via INTRAVENOUS

## 2016-07-27 SURGICAL SUPPLY — 29 items
BALLN ARMADA 5X60X80 (BALLOONS) ×2
BALLN DORADO 7X60X80 (BALLOONS) ×2
BALLN DORADO 8X20X80 (BALLOONS) ×2
BALLN LUTONIX AV 7X60X75 (BALLOONS) ×2
BALLN LUTONIX DCB 7X60X130 (BALLOONS)
BALLOON ARMADA 5X60X80 (BALLOONS) ×1 IMPLANT
BALLOON DORADO 7X60X80 (BALLOONS) ×1 IMPLANT
BALLOON DORADO 8X20X80 (BALLOONS) ×1 IMPLANT
BALLOON LUTONIX AV 7X60X75 (BALLOONS) ×1 IMPLANT
BALLOON LUTONIX DCB 7X60X130 (BALLOONS) IMPLANT
CATH 5FR HK1.0 (CATHETERS) ×2 IMPLANT
CATH C2 65CM (CATHETERS) ×2 IMPLANT
CATH TORCON 5FR 0.38 (CATHETERS) ×2 IMPLANT
DEVICE PRESTO INFLATION (MISCELLANEOUS) ×2 IMPLANT
DEVICE TORQUE (MISCELLANEOUS) ×2 IMPLANT
DRAPE BRACHIAL (DRAPES) ×2 IMPLANT
GLIDECATH ANGL TAPER 5F (CATHETERS) ×2 IMPLANT
GLIDEWIRE .035X150 LONG TAPER (WIRE) ×2 IMPLANT
GUIDEWIRE PFTE-COATED .018X300 (WIRE) ×2 IMPLANT
NEEDLE ENTRY 21GA 7CM ECHOTIP (NEEDLE) ×2 IMPLANT
PACK ANGIOGRAPHY (CUSTOM PROCEDURE TRAY) ×2 IMPLANT
SET INTRO CAPELLA COAXIAL (SET/KITS/TRAYS/PACK) ×2 IMPLANT
SHEATH BRITE TIP 6FRX5.5 (SHEATH) ×2 IMPLANT
SHEATH BRITE TIP 7FRX5.5 (SHEATH) ×2 IMPLANT
STENT VIABAHN 7X100X120 (Permanent Stent) ×2 IMPLANT
STENT VIABAHN 7X50X120 (Permanent Stent) ×1 IMPLANT
STENT VIABAHN 7X5X120 7FR (Permanent Stent) ×1 IMPLANT
TOWEL OR 17X26 4PK STRL BLUE (TOWEL DISPOSABLE) ×2 IMPLANT
WIRE MAGIC TOR.035 180C (WIRE) ×2 IMPLANT

## 2016-07-27 NOTE — Progress Notes (Signed)
Dr. In to talk to pt about procedure results. Pt awake and alert. Eating lunch. tol well. Driver is here with pt to take her home when ready. No co pain.

## 2016-07-27 NOTE — Op Note (Signed)
OPERATIVE NOTE   PROCEDURE: 1. Contrast injection left brachial axillary  AV access 2. Percutaneous transluminal angioplasty with Viabahn stent placement peripheral segment to left arm vein, venous outflow 3. Percutaneous transluminal angioplasty central venous segment to 7 mm left subclavian with Lutonix  PRE-OPERATIVE DIAGNOSIS: Complication of dialysis access                                                       End Stage Renal Disease  POST-OPERATIVE DIAGNOSIS: same as above   SURGEON: Katha Cabal, M.D.  ANESTHESIA: Conscious sedation was administered under my direct supervision. IV Versed plus fentanyl were utilized. Continuous ECG, pulse oximetry and blood pressure was monitored throughout the entire procedure.  Conscious sedation was for a total of 100.  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 female who  presents with malfunctioning left arm brachial axillary AV access.  The patient is scheduled for angiography with possible intervention of the AV access.  The patient is aware the risks include but are not limited to: bleeding, infection, thrombosis of the cannulated access, and possible anaphylactic reaction to the contrast.  The patient acknowledges if the access can not be salvaged a tunneled catheter will be needed and will be placed during this procedure.  The patient is aware of the risks of the procedure and elects to proceed with the angiogram and intervention.  DESCRIPTION: After full informed written consent was obtained, the patient was brought back to the Special Procedure suite and placed supine position.  Appropriate cardiopulmonary monitors were placed.  The left arm was prepped and draped in the standard fashion.  Appropriate  timeout is called. The brachial axillary graft  was cannulated with a micropuncture needle.  Cannulation was performed with ultrasound guidance. Ultrasound was placed in a sterile sleeve, the AV access was interrogated and noted to be echolucent and compressible indicating patency. Image was recorded for the permanent record. The puncture is performed under continuous ultrasound visualization.   The microwire was advanced and the needle was exchanged for  a microsheath.  The J-wire was then advanced and a 6 Fr sheath inserted.  Hand injections were completed to image the access from the arterial anastomosis through the entire access.  The central venous structures were also imaged by hand injections.  Based on the images,  a total of 4,000 units of heparin was given and a wire was negotiated through the strictures within the venous portion of the graft as well as the central stenosis. The sheath was then upsized to a 7 [place] sheath.  An 5 x 12 [place] balloon was used to angioplasty the venous outflow at the level of the grafts venous anastomosis.  Inflation was to 20 atm.  The detector was then repositioned over the peripheral portion of the AV access and a 7 mm x 100 mm Viabahn and subsequently a 7 mm x 50 mm Viabahn stent was deployed across tandem lesions. A 7 x 60 [place] balloon was then advanced across and serial inflation was performed. 2 inflations were required both of which were 238-40 atm for 1 minute.  Unfortunately this did not cause the one stricture to yield and therefore an 8 x 20 [place] balloon was used to treat the stricture within the AV access. Inflation was to 40 for 1 minute, which did crack this lesion and allow for the stent to be fully expanded.  Attention was then turned to a more central lesion involving the subclavian vein. A 7 x 60 Lutonix balloon was advanced across this lesion inflated to 12 atm for 2 minutes. Follow-up imaging demonstrated resolution with less than 5% residual  stenosis however there did appear to be some spasm in an area that previously was fully expanded and therefore a total of 1000 g of heparin was injected through the sheath.  Follow-up imaging demonstrates significant improvement with a marked reduction of all strictures noting approximately 5% residual stenosis of the strictures both peripheral and central.  There is now rapid flow of contrast through the graft and the central veins.   A 4-0 Monocryl purse-string suture was sewn around the sheath.  The sheath was removed and light pressure was applied.  A sterile bandage was applied to the puncture site.   INTERPRETATION: Initially the graft is imaged the graft itself is noted to be in fairly good condition. At the venous anastomosis there is occlusion of the axillary vein. There is a small collateral which connects an accessory axillary vein. After moderate difficulty the wire was negotiated into this vein. This stricture at the venous anastomosis is approximately 95%. More proximally this accessory axillary vein demonstrated an area of marked irregularity with greater than 80% stenosis. The subclavian vein also demonstrated a 7080% stenosis at the level of the clavicular head. Following and plasty and stent placement with full expansion to 7 mm there is less than 5% residual stenosis in the venous outflow peripheral portion. Following and plasty of the central lesion with a Lutonix balloon there is less than 5% residual stenosis.    COMPLICATIONS: None  CONDITION: [name], M.D [place] Office: [phone]  07/27/2016 4:21 PM

## 2016-07-27 NOTE — Discharge Instructions (Signed)
Moderate Conscious Sedation, Adult, Care After °These instructions provide you with information about caring for yourself after your procedure. Your health care provider may also give you more specific instructions. Your treatment has been planned according to current medical practices, but problems sometimes occur. Call your health care provider if you have any problems or questions after your procedure. °What can I expect after the procedure? °After your procedure, it is common: °· To feel sleepy for several hours. °· To feel clumsy and have poor balance for several hours. °· To have poor judgment for several hours. °· To vomit if you eat too soon. °Follow these instructions at home: °For at least 24 hours after the procedure:  °· Do not: °¨ Participate in activities where you could fall or become injured. °¨ Drive. °¨ Use heavy machinery. °¨ Drink alcohol. °¨ Take sleeping pills or medicines that cause drowsiness. °¨ Make important decisions or sign legal documents. °¨ Take care of children on your own. °· Rest. °Eating and drinking °· Follow the diet recommended by your health care provider. °· If you vomit: °¨ Drink water, juice, or soup when you can drink without vomiting. °¨ Make sure you have little or no nausea before eating solid foods. °General instructions °· Have a responsible adult stay with you until you are awake and alert. °· Take over-the-counter and prescription medicines only as told by your health care provider. °· If you smoke, do not smoke without supervision. °· Keep all follow-up visits as told by your health care provider. This is important. °Contact a health care provider if: °· You keep feeling nauseous or you keep vomiting. °· You feel light-headed. °· You develop a rash. °· You have a fever. °Get help right away if: °· You have trouble breathing. °This information is not intended to replace advice given to you by your health care provider. Make sure you discuss any questions you have  with your health care provider. °Document Released: 04/18/2013 Document Revised: 12/01/2015 Document Reviewed: 10/18/2015 °Elsevier Interactive Patient Education © 2017 [employer] °Venogram, Care After °Refer to this sheet in the next few weeks. These instructions provide you with information on caring for yourself after your procedure. Your health care provider may also give you more specific instructions. Your treatment has been planned according to current medical practices, but problems sometimes occur. Call your health care provider if you have any problems or questions after your procedure. °WHAT TO EXPECT AFTER THE PROCEDURE °After your procedure, it is typical to have the following sensations: °· Mild discomfort at the catheter insertion site. °HOME CARE INSTRUCTIONS  °· Take all medicines exactly as directed. °· Follow any prescribed diet. °· Follow instructions regarding both rest and physical activity. °· Drink more fluids for the first several days after the procedure in order to help flush dye from your kidneys. °SEEK MEDICAL CARE IF: °· You develop a rash. °· You have fever not controlled by medicine. °SEEK IMMEDIATE MEDICAL CARE IF: °· There is pain, drainage, bleeding, redness, swelling, warmth or a red streak at the site of the IV tube. °· The extremity where your IV tube was placed becomes discolored, numb, or cool. °· You have difficulty breathing or shortness of breath. °· You develop chest pain. °· You have excessive dizziness or fainting. °This information is not intended to replace advice given to you by your health care provider. Make sure you discuss any questions you have with your health care provider. °Document Released: 04/18/2013 Document Revised: 07/03/2013   Document Reviewed: 04/18/2013 °Elsevier Interactive Patient Education © 2017 [employer] Inc[employer] ° °

## 2016-07-27 NOTE — Progress Notes (Signed)
1/2 amp of D50 given very slow push. Pt ready for procedure.  No c/o

## 2016-07-27 NOTE — Progress Notes (Signed)
Discharge instructions given to pt. All questions answered pt states she understands. Follow up plan of care discussed. Follow up Dr appt has been made. Pt aware she may use fistula site for dialysis.band aide dressing to left fistula site dry and intact. No drainage noted. Good bruitt and thrill noted.

## 2016-07-27 NOTE — Progress Notes (Signed)
Pt stable home. tol procedure well

## 2016-07-27 NOTE — Progress Notes (Signed)
Pt to procedure. Follow up blood sugar done.

## 2016-07-27 NOTE — H&P (Signed)
[facility] Admission History & Physical  MRN : [id number]  [name] is a 76 y.o. (Nov 30, 1940) female who presents with chief complaint of No chief complaint on file. Marland Kitchen.  History of Present Illness: I am asked to evaluate the patient by the dialysis center. The patient was sent here because they were unable to achieve adequate dialysis this morning. Furthermore the Center states there is very poor thrill and bruit. The patient states there there have been increasing problems with the access, such as "pulling clots" during dialysis and prolonged bleeding after decannulation. The patient estimates these problems have been going on for several weeks. The patient is unaware of any other change.  Patient denies pain or tenderness overlying the access.  There is no pain with dialysis.  The patient denies hand pain or finger pain consistent with steal syndrome.   There have several past interventions and declots of this access.  The patient is not chronically hypotensive on dialysis.  Current Facility-Administered Medications  Medication Dose Route Frequency Provider Last Rate Last Dose  . clindamycin (CLEOCIN) 300 MG/50ML IVPB           . 0.9 %  sodium chloride infusion   Intravenous Continuous Kimberly A Stegmayer, PA-C      . clindamycin (CLEOCIN) IVPB 300 mg  300 mg Intravenous Once BlueLinxKimberly A Stegmayer, PA-C      . famotidine (PEPCID) tablet 40 mg  40 mg Oral PRN Ranae PlumberKimberly A Stegmayer, PA-C      . HYDROmorphone (DILAUDID) injection 1 mg  1 mg Intravenous Once [employer]xKimberly A Stegmayer, PA-C      . methylPREDNISolone sodium succinate (SOLU-MEDROL) 125 mg/2 mL injection 125 mg  125 mg Intravenous PRN Kimberly A Stegmayer, PA-C      . ondansetron (ZOFRAN) injection 4 mg  4 mg Intravenous Q6H PRN Tonette LedererKimberly A Stegmayer, PA-C        Past Medical History:  Diagnosis Date  . Chronic kidney disease   . Diabetes mellitus without complication (HCC)   . Hypertension   .  Stroke [facility](HCC)     Past Surgical History:  Procedure Laterality Date  . PERIPHERAL VASCULAR CATHETERIZATION N/A 01/21/2015   Procedure: Dialysis/Perma Catheter Removal;  Surgeon: Renford DillsGregory G Schnier, MD;  Location: [facility] INVASIVE CV LAB;  Service: Cardiovascular;  Laterality: N/A;  . PERIPHERAL VASCULAR CATHETERIZATION Left 01/21/2015   Procedure: A/V Shuntogram/Fistulagram;  Surgeon: Renford DillsGregory G Schnier, MD;  Location: [facility] INVASIVE CV LAB;  Service: Cardiovascular;  Laterality: Left;  . PERIPHERAL VASCULAR CATHETERIZATION Left 04/27/2016   Procedure: A/V Shuntogram/Fistulagram;  Surgeon: Renford DillsGregory G Schnier, MD;  Location: [facility] INVASIVE CV LAB;  Service: Cardiovascular;  Laterality: Left;  . PERIPHERAL VASCULAR CATHETERIZATION N/A 04/27/2016   Procedure: A/V Shunt Intervention;  Surgeon: Renford DillsGregory G Schnier, MD;  Location: [facility] INVASIVE CV LAB;  Service: Cardiovascular;  Laterality: N/A;  . VASCULAR SURGERY      Social History Social History  Substance Use Topics  . Smoking status: Former Smoker    Types: Cigarettes    Quit date: 01/20/2013  . Smokeless tobacco: Never Used  . Alcohol use No    Family History Family History  Problem Relation Age of Onset  . Hypertension Father   . Heart disease Father     No family history of bleeding or clotting disorders, autoimmune disease or porphyria  Allergies  Allergen Reactions  . Nsaids Hives  . Ibuprofen Nausea And Vomiting  . Penicillins Rash    Swelling eyes Has  patient had a PCN reaction causing immediate rash, facial/tongue/throat swelling, SOB or lightheadedness with hypotension: Yes Has patient had a PCN reaction causing severe rash involving mucus membranes or skin necrosis: No Has patient had a PCN reaction that required hospitalization No Has patient had a PCN reaction occurring within the last 10 years: No If all of the above answers are "NO", then may proceed with Cephalosporin use.   . Sulfa Antibiotics Rash     REVIEW OF  SYSTEMS (Negative unless checked)  Constitutional: [] Weight loss  [] Fever  [] Chills Cardiac: [] Chest pain   [] Chest pressure   [] Palpitations   [] Shortness of breath when laying flat   [] Shortness of breath at rest   [x] Shortness of breath with exertion. Vascular:  [] Pain in legs with walking   [] Pain in legs at rest   [] Pain in legs when laying flat   [] Claudication   [] Pain in feet when walking  [] Pain in feet at rest  [] Pain in feet when laying flat   [] History of DVT   [] Phlebitis   [] Swelling in legs   [] Varicose veins   [] Non-healing ulcers Pulmonary:   [] Uses home oxygen   [] Productive cough   [] Hemoptysis   [] Wheeze  [] COPD   [] Asthma Neurologic:  [] Dizziness  [] Blackouts   [] Seizures   [] History of stroke   [] History of TIA  [] Aphasia   [] Temporary blindness   [] Dysphagia   [] Weakness or numbness in arms   [] Weakness or numbness in legs Musculoskeletal:  [] Arthritis   [] Joint swelling   [] Joint pain   [] Low back pain Hematologic:  [] Easy bruising  [] Easy bleeding   [] Hypercoagulable state   [] Anemic  [] Hepatitis Gastrointestinal:  [] Blood in stool   [] Vomiting blood  [] Gastroesophageal reflux/heartburn   [] Difficulty swallowing. Genitourinary:  [x] Chronic kidney disease   [] Difficult urination  [] Frequent urination  [] Burning with urination   [] Blood in urine Skin:  [] Rashes   [] Ulcers   [] Wounds Psychological:  [] History of anxiety   []  History of major depression.  Physical Examination  Vitals:   07/27/16 1339  BP: (!) 175/72  Pulse: 71  Resp: 16  Temp: 98.3 F (36.8 C)  TempSrc: Oral  SpO2: 97%  Weight: 78.5 kg (173 lb)  Height: 5\' 9"  (1.753 m)   Body mass index is 25.55 kg/m. Gen: WD/WN, NAD Head: [place]/AT, No temporalis wasting. Prominent temp pulse not noted. Ear/Nose/Throat: Hearing grossly intact, nares w/o erythema or drainage, oropharynx w/o Erythema/Exudate,  Eyes: Conjunctiva clear, sclera non-icteric Neck: Trachea midline.  No JVD.  Pulmonary:  Good air movement,  respirations not labored, no use of accessory muscles.  Cardiac: RRR, normal S1, S2. Vascular: Left arm brachial axillary dialysis graft with a weak thrill and a very soft bruit Vessel Right Left  Radial Palpable Palpable  Ulnar Not Palpable Not Palpable  Brachial Palpable Palpable  Carotid Palpable, without bruit Palpable, without bruit  Gastrointestinal: soft, non-tender/non-distended. No guarding/reflex.  Musculoskeletal: M/S 5/5 throughout.  Extremities without ischemic changes.  No deformity or atrophy.  Neurologic: Sensation grossly intact in extremities.  Symmetrical.  Speech is fluent. Motor exam as listed above. Psychiatric: Judgment intact, Mood & affect appropriate for pt's clinical situation. Dermatologic: No rashes or ulcers noted.  No cellulitis or open wounds. Lymph : No Cervical, Axillary, or Inguinal lymphadenopathy.   CBC Lab Results  Component Value Date   WBC 7.2 08/19/2014   HGB 8.0 (L) 08/19/2014   HCT 25.4 (L) 08/19/2014   MCV 106 (H) 08/19/2014   PLT 103 (  L) 08/19/2014    BMET    Component Value Date/Time   NA 141 08/19/2014 0411   K 5.1 08/19/2014 0411   CL 100 08/19/2014 0411   CO2 32 08/19/2014 0411   GLUCOSE 84 08/19/2014 0411   BUN 72 (H) 08/19/2014 0411   CREATININE 7.86 (H) 08/19/2014 0411   CALCIUM 7.6 (L) 08/19/2014 0411   GFRNONAA 5 (L) 08/19/2014 0411   GFRNONAA 6 (L) 02/26/2014 1510   GFRAA 6 (L) 08/19/2014 0411   GFRAA 7 (L) 02/26/2014 1510   CrCl cannot be calculated (Patient's most recent lab result is older than the maximum 21 days allowed.).  COAG Lab Results  Component Value Date   INR 1.0 04/12/2014   INR 1.1 02/26/2014    Radiology No results found.  Assessment/Plan 1.  Complication dialysis device with thrombosis AV access:  Patient's Left brachial axillary dialysis access is malfunctioning. The patient will undergo angiography and correction of any problems using interventional techniques with the hope of restoring  function to the access.  The risks and benefits were described to the patient.  All questions were answered.  The patient agrees to proceed with angiography and intervention. Potassium will be drawn to ensure that it is an appropriate level prior to performing intervention. 2.  End-stage renal disease requiring hemodialysis:  Patient will continue dialysis therapy without further interruption if a successful intervention is not achieved then a tunneled catheter will be placed. Dialysis has already been arranged. 3.  Hypertension:  Patient will continue medical management; nephrology is following no changes in oral medications. 4. Diabetes mellitus:  Glucose will be monitored and oral medications been held this morning once the patient has undergone the patient's procedure po intake will be reinitiated and again Accu-Cheks will be used to assess the blood glucose level and treat as needed. The patient will be restarted on the patient's usual hypoglycemic regime 5.  Coronary artery disease:  EKG will be monitored. Nitrates will be used if needed. The patient's oral cardiac medications will be continued.    Levora Dredge, MD  07/27/2016 1:40 PM

## 2016-07-29 ENCOUNTER — Encounter: Payer: Self-pay | Admitting: Vascular Surgery

## 2016-09-02 ENCOUNTER — Encounter (INDEPENDENT_AMBULATORY_CARE_PROVIDER_SITE_OTHER): Payer: Medicare Other

## 2016-09-02 ENCOUNTER — Ambulatory Visit (INDEPENDENT_AMBULATORY_CARE_PROVIDER_SITE_OTHER): Payer: Medicare Other | Admitting: Vascular Surgery

## 2016-11-11 ENCOUNTER — Ambulatory Visit (INDEPENDENT_AMBULATORY_CARE_PROVIDER_SITE_OTHER): Payer: Medicare Other | Admitting: Vascular Surgery

## 2016-11-11 ENCOUNTER — Encounter (INDEPENDENT_AMBULATORY_CARE_PROVIDER_SITE_OTHER): Payer: Medicare Other

## 2016-11-18 ENCOUNTER — Encounter (INDEPENDENT_AMBULATORY_CARE_PROVIDER_SITE_OTHER): Payer: Self-pay | Admitting: Vascular Surgery

## 2016-11-18 ENCOUNTER — Other Ambulatory Visit (INDEPENDENT_AMBULATORY_CARE_PROVIDER_SITE_OTHER): Payer: Medicare Other

## 2016-11-18 ENCOUNTER — Ambulatory Visit (INDEPENDENT_AMBULATORY_CARE_PROVIDER_SITE_OTHER): Payer: Medicare Other | Admitting: Vascular Surgery

## 2016-11-18 VITALS — BP 212/83 | HR 71 | Resp 16 | Ht 69.0 in | Wt 175.0 lb

## 2016-11-18 DIAGNOSIS — N186 End stage renal disease: Secondary | ICD-10-CM | POA: Diagnosis not present

## 2016-11-18 DIAGNOSIS — Z992 Dependence on renal dialysis: Secondary | ICD-10-CM

## 2016-11-18 DIAGNOSIS — I10 Essential (primary) hypertension: Secondary | ICD-10-CM | POA: Diagnosis not present

## 2016-11-18 DIAGNOSIS — T829XXD Unspecified complication of cardiac and vascular prosthetic device, implant and graft, subsequent encounter: Secondary | ICD-10-CM | POA: Diagnosis not present

## 2016-11-18 DIAGNOSIS — E118 Type 2 diabetes mellitus with unspecified complications: Secondary | ICD-10-CM

## 2016-11-18 DIAGNOSIS — T829XXS Unspecified complication of cardiac and vascular prosthetic device, implant and graft, sequela: Secondary | ICD-10-CM | POA: Diagnosis not present

## 2016-11-18 NOTE — Progress Notes (Signed)
MRN : [id number]  [name] is a 76 y.o. (06-Apr-1941) female who presents with chief complaint of  Chief Complaint  Patient presents with  . Re-evaluation    Ultrasound follow up  .  History of Present Illness: The patient returns to the office for followup of their dialysis access. The function of the access has been stable. The patient denies increased bleeding time or increased recirculation. Patient denies difficulty with cannulation. The patient denies hand pain or other symptoms consistent with steal phenomena.  No significant arm swelling.  The patient denies redness or swelling at the access site. The patient denies fever or chills at home or while on dialysis.  The patient denies amaurosis fugax or recent TIA symptoms. There are no recent neurological changes noted. The patient denies claudication symptoms or rest pain symptoms. The patient denies history of DVT, PE or superficial thrombophlebitis. The patient denies recent episodes of angina or shortness of breath.     Current Meds  Medication Sig  . amLODipine (NORVASC) 5 MG tablet Take by mouth.  Marland Kitchen aspirin (ASPIRIN EC) 81 MG EC tablet Take 81 mg by mouth daily. Swallow whole.  . b complex-vitamin c-folic acid (NEPHRO-VITE) 0.8 MG TABS tablet Take 1 tablet by mouth daily after supper.  . calcium acetate (PHOSLO) 667 MG capsule Take 1,334 mg by mouth 3 (three) times daily with meals.  . carvedilol (COREG) 12.5 MG tablet Take 12.5 mg by mouth 2 (two) times daily.   . carvedilol (COREG) 25 MG tablet TK 2 TS PO BID  . Cholecalciferol (VITAMIN D3) 1000 units CHEW Place into the urethra.  . cinacalcet (SENSIPAR) 30 MG tablet Take 30 mg by mouth daily with supper.  . citalopram (CELEXA) 10 MG tablet TK 1 T PO QD UTD  . dexlansoprazole (DEXILANT) 60 MG capsule Take 60 mg by mouth daily.  Marland Kitchen DILT-XR 180 MG 24 hr capsule TK ONE C PO QAM  . diltiazem (CARDIZEM CD) 180 MG 24 hr capsule Take 180 mg by mouth daily.  .  DULoxetine (CYMBALTA) 30 MG capsule Take 30 mg by mouth daily.  . ergocalciferol (VITAMIN D2) 50000 UNITS capsule Take 50,000 Units by mouth once a week.  . hydrALAZINE (APRESOLINE) 50 MG tablet Take 50 mg by mouth 3 (three) times daily.   . Insulin Detemir (LEVEMIR FLEXTOUCH) 100 UNIT/ML Pen Inject 6 Units into the skin daily at 10 pm.   . levothyroxine (SYNTHROID, LEVOTHROID) 50 MCG tablet Take 50 mcg by mouth daily before breakfast.  . lisinopril (PRINIVIL,ZESTRIL) 40 MG tablet Take 40 mg by mouth daily.  Marland Kitchen lubiprostone (AMITIZA) 8 MCG capsule Take 8 mcg by mouth 2 (two) times daily as needed for constipation.   Marland Kitchen omeprazole (PRILOSEC) 20 MG capsule Take by mouth.  . pantoprazole (PROTONIX) 40 MG tablet TK 1 T PO  QD  . rosuvastatin (CRESTOR) 10 MG tablet Take by mouth.  . traMADol (ULTRAM) 50 MG tablet Take 50 mg by mouth daily.     Past Medical History:  Diagnosis Date  . Chronic kidney disease   . Diabetes mellitus without complication (HCC)   . Hypertension   . Stroke [facility])     Past Surgical History:  Procedure Laterality Date  . PERIPHERAL VASCULAR CATHETERIZATION N/A 01/21/2015   Procedure: Dialysis/Perma Catheter Removal;  Surgeon: Renford Dills, MD;  Location: [facility] INVASIVE CV LAB;  Service: Cardiovascular;  Laterality: N/A;  . PERIPHERAL VASCULAR CATHETERIZATION Left 01/21/2015   Procedure: A/V Shuntogram/Fistulagram;  Surgeon: Renford Dills, MD;  Location: [facility] INVASIVE CV LAB;  Service: Cardiovascular;  Laterality: Left;  . PERIPHERAL VASCULAR CATHETERIZATION Left 04/27/2016   Procedure: A/V Shuntogram/Fistulagram;  Surgeon: Renford Dills, MD;  Location: [facility] INVASIVE CV LAB;  Service: Cardiovascular;  Laterality: Left;  . PERIPHERAL VASCULAR CATHETERIZATION N/A 04/27/2016   Procedure: A/V Shunt Intervention;  Surgeon: Renford Dills, MD;  Location: [facility] INVASIVE CV LAB;  Service: Cardiovascular;  Laterality: N/A;  . PERIPHERAL VASCULAR CATHETERIZATION Left  07/27/2016   Procedure: A/V Shuntogram;  Surgeon: Renford Dills, MD;  Location: [facility] INVASIVE CV LAB;  Service: Cardiovascular;  Laterality: Left;  Marland Kitchen VASCULAR SURGERY      Social History Social History  Substance Use Topics  . Smoking status: Former Smoker    Types: Cigarettes    Quit date: 01/20/2013  . Smokeless tobacco: Never Used  . Alcohol use No    Family History Family History  Problem Relation Age of Onset  . Hypertension Father   . Heart disease Father     Allergies  Allergen Reactions  . Nsaids Hives  . Ibuprofen Nausea And Vomiting  . Penicillins Rash    Swelling eyes Has patient had a PCN reaction causing immediate rash, facial/tongue/throat swelling, SOB or lightheadedness with hypotension: Yes Has patient had a PCN reaction causing severe rash involving mucus membranes or skin necrosis: No Has patient had a PCN reaction that required hospitalization No Has patient had a PCN reaction occurring within the last 10 years: No If all of the above answers are "NO", then may proceed with Cephalosporin use.   . Sulfa Antibiotics Rash     REVIEW OF SYSTEMS (Negative unless checked)  Constitutional: [] Weight loss  [] Fever  [] Chills Cardiac: [] Chest pain   [] Chest pressure   [] Palpitations   [] Shortness of breath when laying flat   [] Shortness of breath with exertion. Vascular:  [] Pain in legs with walking   [] Pain in legs at rest  [] History of DVT   [] Phlebitis   [] Swelling in legs   [] Varicose veins   [] Non-healing ulcers Pulmonary:   [] Uses home oxygen   [] Productive cough   [] Hemoptysis   [] Wheeze  [] COPD   [] Asthma Neurologic:  [] Dizziness   [] Seizures   [] History of stroke   [] History of TIA  [] Aphasia   [] Vissual changes   [] Weakness or numbness in arm   [] Weakness or numbness in leg Musculoskeletal:   [] Joint swelling   [] Joint pain   [] Low back pain Hematologic:  [] Easy bruising  [] Easy bleeding   [] Hypercoagulable state   [] Anemic Gastrointestinal:   [] Diarrhea   [] Vomiting  [] Gastroesophageal reflux/heartburn   [] Difficulty swallowing. Genitourinary:  [x] Chronic kidney disease   [] Difficult urination  [] Frequent urination   [] Blood in urine Skin:  [] Rashes   [] Ulcers  Psychological:  [] History of anxiety   []  History of major depression.  Physical Examination  Vitals:   11/18/16 1614  BP: (!) 212/83  Pulse: 71  Resp: 16  Weight: 79.4 kg (175 lb)  Height: 5\' 9"  (1.753 m)   Body mass index is 25.84 kg/m. Gen: WD/WN, NAD Head: [place]/AT, No temporalis wasting.  Ear/Nose/Throat: Hearing grossly intact, nares w/o erythema or drainage Eyes: PER, EOMI, sclera nonicteric.  Neck: Supple, no large masses.   Pulmonary:  Good air movement, no audible wheezing bilaterally, no use of accessory muscles.  Cardiac: RRR, no JVD Vascular: left brachial axillary graft good thrill and good briut  Skin in good condition Vessel Right Left  Radial Palpable Palpable  Ulnar Palpable Palpable  Brachial Palpable Palpable  Gastrointestinal: Non-distended. No guarding/no peritoneal signs.  Musculoskeletal: M/S 5/5 throughout.  No deformity or atrophy.  Neurologic: CN 2-12 intact. Symmetrical.  Speech is fluent. Motor exam as listed above. Psychiatric: Judgment intact, Mood & affect appropriate for pt's clinical situation. Dermatologic: No rashes or ulcers noted.  No changes consistent with cellulitis. Lymph : No lichenification or skin changes of chronic lymphedema.  CBC Lab Results  Component Value Date   WBC 7.2 08/19/2014   HGB 8.0 (L) 08/19/2014   HCT 25.4 (L) 08/19/2014   MCV 106 (H) 08/19/2014   PLT 103 (L) 08/19/2014    BMET    Component Value Date/Time   NA 141 08/19/2014 0411   K 5.1 08/19/2014 0411   CL 100 08/19/2014 0411   CO2 32 08/19/2014 0411   GLUCOSE 84 08/19/2014 0411   BUN 72 (H) 08/19/2014 0411   CREATININE 7.86 (H) 08/19/2014 0411   CALCIUM 7.6 (L) 08/19/2014 0411   GFRNONAA 5 (L) 08/19/2014 0411   GFRNONAA 6 (L)  02/26/2014 1510   GFRAA 6 (L) 08/19/2014 0411   GFRAA 7 (L) 02/26/2014 1510   CrCl cannot be calculated (Patient's most recent lab result is older than the maximum 21 days allowed.).  COAG Lab Results  Component Value Date   INR 1.0 04/12/2014   INR 1.1 02/26/2014    Radiology No results found.  Assessment/Plan 1. ESRD on dialysis [facility](HCC) Recommend:  The patient is doing well and currently has adequate dialysis access. The patient's dialysis center is not reporting any access issues.  Flow pattern is stable when compared to the prior ultrasound.  The lesion noted more proximally does not appear to be affecting the flow given the lack of of pulsatility and the volume flow of 1052 ml/min.  The patient should have a follow up in 3 months. The patient will follow-up with me in the office   2. Essential hypertension Continue antihypertensive medications as already ordered, these medications have been reviewed and there are no changes at this time.   3. Type 2 diabetes mellitus with complication, unspecified whether long term insulin use (HCC) Continue hypoglycemic medications as already ordered, these medications have been reviewed and there are no changes at this time.  Hgb A1C to be monitored as already arranged by primary service   4. Complication from renal dialysis device, sequela See #1    Levora DredgeGregory Corliss Coggeshall, MD  11/18/2016 9:05 PM

## 2016-11-19 ENCOUNTER — Other Ambulatory Visit (INDEPENDENT_AMBULATORY_CARE_PROVIDER_SITE_OTHER): Payer: Self-pay | Admitting: Vascular Surgery

## 2016-11-19 DIAGNOSIS — T829XXD Unspecified complication of cardiac and vascular prosthetic device, implant and graft, subsequent encounter: Secondary | ICD-10-CM

## 2016-11-19 DIAGNOSIS — N186 End stage renal disease: Secondary | ICD-10-CM

## 2017-02-24 ENCOUNTER — Encounter (INDEPENDENT_AMBULATORY_CARE_PROVIDER_SITE_OTHER): Payer: Self-pay

## 2017-02-24 ENCOUNTER — Ambulatory Visit (INDEPENDENT_AMBULATORY_CARE_PROVIDER_SITE_OTHER): Payer: Medicare Other | Admitting: Vascular Surgery

## 2017-02-24 ENCOUNTER — Encounter (INDEPENDENT_AMBULATORY_CARE_PROVIDER_SITE_OTHER): Payer: Self-pay | Admitting: Vascular Surgery

## 2017-02-24 VITALS — BP 180/66 | HR 70 | Resp 16 | Ht 69.0 in | Wt 169.0 lb

## 2017-02-24 DIAGNOSIS — Z992 Dependence on renal dialysis: Secondary | ICD-10-CM

## 2017-02-24 DIAGNOSIS — N186 End stage renal disease: Secondary | ICD-10-CM | POA: Diagnosis not present

## 2017-02-24 DIAGNOSIS — T829XXS Unspecified complication of cardiac and vascular prosthetic device, implant and graft, sequela: Secondary | ICD-10-CM | POA: Diagnosis not present

## 2017-02-24 DIAGNOSIS — I10 Essential (primary) hypertension: Secondary | ICD-10-CM | POA: Diagnosis not present

## 2017-02-24 DIAGNOSIS — E118 Type 2 diabetes mellitus with unspecified complications: Secondary | ICD-10-CM

## 2017-02-24 NOTE — Progress Notes (Signed)
MRN : [id number]  [name] is a 76 y.o. (05-31-1941) female who presents with chief complaint of  Chief Complaint  Patient presents with  . Follow-up    3 months f/l  Pt stated "have continuing bleeding after dialysis and take 45 minutes to stop it"    History of Present Illness:   The patient returns to the office for follow up regarding problem with the dialysis access. Currently the patient is maintained via a left arm AV graft The patient has had multiple angiograms in the past the most recent of which was:  PROCEDURE 07/27/2016: 1. Contrast injection left brachial axillary  AV access 2. Percutaneous transluminal angioplasty with Viabahn stent placement peripheral segment to left arm vein, venous outflow 3. Percutaneous transluminal angioplasty central venous segment to 7 mm left subclavian with Lutonix  The patient notes a significant increase in bleeding time after decannulation.  He notes that over the past several dialysis sessions she is holding pressure 45 minutes or longer.  The patient has also been informed that there is increased recirculation.    The patient denies hand pain or other symptoms consistent with steal phenomena.  No significant arm swelling.  The patient denies redness or swelling at the access site. The patient denies fever or chills at home or while on dialysis.  The patient denies amaurosis fugax or recent TIA symptoms. There are no recent neurological changes noted. The patient denies claudication symptoms or rest pain symptoms. The patient denies history of DVT, PE or superficial thrombophlebitis. The patient denies recent episodes of angina or shortness of breath.    Current Meds  Medication Sig  . amLODipine (NORVASC) 5 MG tablet Take by mouth.  Marland Kitchen. aspirin (ASPIRIN EC) 81 MG EC tablet Take 81 mg by mouth daily. Swallow whole.  . b complex-vitamin c-folic acid (NEPHRO-VITE) 0.8 MG TABS tablet Take 1 tablet by mouth daily after supper.  .  calcium acetate (PHOSLO) 667 MG capsule Take 1,334 mg by mouth 3 (three) times daily with meals.  . carvedilol (COREG) 12.5 MG tablet Take 12.5 mg by mouth 2 (two) times daily.   . carvedilol (COREG) 25 MG tablet TK 2 TS PO BID  . Cholecalciferol (VITAMIN D3) 1000 units CHEW Place into the urethra.  . cinacalcet (SENSIPAR) 30 MG tablet Take 30 mg by mouth daily with supper.  . citalopram (CELEXA) 10 MG tablet TK 1 T PO QD UTD  . dexlansoprazole (DEXILANT) 60 MG capsule Take 60 mg by mouth daily.  Marland Kitchen. DILT-XR 180 MG 24 hr capsule TK ONE C PO QAM  . diltiazem (CARDIZEM CD) 180 MG 24 hr capsule Take 180 mg by mouth daily.  . ergocalciferol (VITAMIN D2) 50000 UNITS capsule Take 50,000 Units by mouth once a week.  . hydrALAZINE (APRESOLINE) 50 MG tablet Take 50 mg by mouth 3 (three) times daily.   . Insulin Detemir (LEVEMIR FLEXTOUCH) 100 UNIT/ML Pen Inject 6 Units into the skin daily at 10 pm.   . levothyroxine (SYNTHROID, LEVOTHROID) 50 MCG tablet Take 50 mcg by mouth daily before breakfast.  . lisinopril (PRINIVIL,ZESTRIL) 40 MG tablet Take 40 mg by mouth daily.  Marland Kitchen. lubiprostone (AMITIZA) 8 MCG capsule Take 8 mcg by mouth 2 (two) times daily as needed for constipation.   . traMADol (ULTRAM) 50 MG tablet Take 50 mg by mouth daily.     Past Medical History:  Diagnosis Date  . Chronic kidney disease   . Diabetes mellitus without complication (HCC)   .  Hypertension   . Stroke [facility])     Past Surgical History:  Procedure Laterality Date  . PERIPHERAL VASCULAR CATHETERIZATION N/A 01/21/2015   Procedure: Dialysis/Perma Catheter Removal;  Surgeon: Renford Dills, MD;  Location: [facility] INVASIVE CV LAB;  Service: Cardiovascular;  Laterality: N/A;  . PERIPHERAL VASCULAR CATHETERIZATION Left 01/21/2015   Procedure: A/V Shuntogram/Fistulagram;  Surgeon: Renford Dills, MD;  Location: [facility] INVASIVE CV LAB;  Service: Cardiovascular;  Laterality: Left;  . PERIPHERAL VASCULAR CATHETERIZATION Left  04/27/2016   Procedure: A/V Shuntogram/Fistulagram;  Surgeon: Renford Dills, MD;  Location: [facility] INVASIVE CV LAB;  Service: Cardiovascular;  Laterality: Left;  . PERIPHERAL VASCULAR CATHETERIZATION N/A 04/27/2016   Procedure: A/V Shunt Intervention;  Surgeon: Renford Dills, MD;  Location: [facility] INVASIVE CV LAB;  Service: Cardiovascular;  Laterality: N/A;  . PERIPHERAL VASCULAR CATHETERIZATION Left 07/27/2016   Procedure: A/V Shuntogram;  Surgeon: Renford Dills, MD;  Location: [facility] INVASIVE CV LAB;  Service: Cardiovascular;  Laterality: Left;  Marland Kitchen VASCULAR SURGERY      Social History Social History  Substance Use Topics  . Smoking status: Former Smoker    Types: Cigarettes    Quit date: 01/20/2013  . Smokeless tobacco: Never Used  . Alcohol use No    Family History Family History  Problem Relation Age of Onset  . Hypertension Father   . Heart disease Father     Allergies  Allergen Reactions  . Nsaids Hives  . Ibuprofen Nausea And Vomiting  . Penicillins Rash    Swelling eyes Has patient had a PCN reaction causing immediate rash, facial/tongue/throat swelling, SOB or lightheadedness with hypotension: Yes Has patient had a PCN reaction causing severe rash involving mucus membranes or skin necrosis: No Has patient had a PCN reaction that required hospitalization No Has patient had a PCN reaction occurring within the last 10 years: No If all of the above answers are "NO", then may proceed with Cephalosporin use.   . Sulfa Antibiotics Rash     REVIEW OF SYSTEMS (Negative unless checked)  Constitutional: [] Weight loss  [] Fever  [] Chills Cardiac: [] Chest pain   [] Chest pressure   [] Palpitations   [] Shortness of breath when laying flat   [] Shortness of breath with exertion. Vascular:  [] Pain in legs with walking   [] Pain in legs at rest  [] History of DVT   [] Phlebitis   [] Swelling in legs   [] Varicose veins   [] Non-healing ulcers Pulmonary:   [] Uses home oxygen    [] Productive cough   [] Hemoptysis   [] Wheeze  [] COPD   [] Asthma Neurologic:  [] Dizziness   [] Seizures   [] History of stroke   [] History of TIA  [] Aphasia   [] Vissual changes   [] Weakness or numbness in arm   [] Weakness or numbness in leg Musculoskeletal:   [] Joint swelling   [] Joint pain   [] Low back pain Hematologic:  [] Easy bruising  [] Easy bleeding   [] Hypercoagulable state   [] Anemic Gastrointestinal:  [] Diarrhea   [] Vomiting  [] Gastroesophageal reflux/heartburn   [] Difficulty swallowing. Genitourinary:  [x] Chronic kidney disease   [] Difficult urination  [] Frequent urination   [] Blood in urine Skin:  [] Rashes   [] Ulcers  Psychological:  [] History of anxiety   []  History of major depression.  Physical Examination  Vitals:   02/24/17 1325  BP: (!) 180/66  Pulse: 70  Resp: 16  Weight: 169 lb (76.7 kg)  Height: 5\' 9"  (1.753 m)   Body mass index is 24.96 kg/m. Gen: WD/WN, NAD Head: /AT, No temporalis wasting.  Ear/Nose/Throat: Hearing grossly intact, nares w/o erythema or drainage Eyes: PER, EOMI, sclera nonicteric.  Neck: Supple, no large masses.   Pulmonary:  Good air movement, no audible wheezing bilaterally, no use of accessory muscles.  Cardiac: RRR, no JVD Vascular:  Left brachial axillary AV graft markedly pulsatile with a staccato bruit. Skin overlying the graft appears intact and healthy no evidence of breakdown ulceration or infection Vessel Right Left  Radial Palpable Palpable  Ulnar Palpable Palpable  Brachial Palpable Palpable  Gastrointestinal: Non-distended. No guarding/no peritoneal signs.  Musculoskeletal: M/S 5/5 throughout.  No deformity or atrophy.  Neurologic: CN 2-12 intact. Symmetrical.  Speech is fluent. Motor exam as listed above. Psychiatric: Judgment intact, Mood & affect appropriate for pt's clinical situation. Dermatologic: No rashes or ulcers noted.  No changes consistent with cellulitis. Lymph : No lichenification or skin changes of chronic  lymphedema.  CBC Lab Results  Component Value Date   WBC 7.2 08/19/2014   HGB 8.0 (L) 08/19/2014   HCT 25.4 (L) 08/19/2014   MCV 106 (H) 08/19/2014   PLT 103 (L) 08/19/2014    BMET    Component Value Date/Time   NA 141 08/19/2014 0411   K 5.1 08/19/2014 0411   CL 100 08/19/2014 0411   CO2 32 08/19/2014 0411   GLUCOSE 84 08/19/2014 0411   BUN 72 (H) 08/19/2014 0411   CREATININE 7.86 (H) 08/19/2014 0411   CALCIUM 7.6 (L) 08/19/2014 0411   GFRNONAA 5 (L) 08/19/2014 0411   GFRNONAA 6 (L) 02/26/2014 1510   GFRAA 6 (L) 08/19/2014 0411   GFRAA 7 (L) 02/26/2014 1510   CrCl cannot be calculated (Patient's most recent lab result is older than the maximum 21 days allowed.).  COAG Lab Results  Component Value Date   INR 1.0 04/12/2014   INR 1.1 02/26/2014    Radiology No results found.  Assessment/Plan 1. Complication from renal dialysis device, sequela Recommend:  The patient is experiencing increasing problems with their dialysis access.  Patient should have a fistulagram with the intention for intervention.  The intention for intervention is to restore appropriate flow and prevent thrombosis and possible loss of the access.  As well as improve the quality of dialysis therapy.  The risks, benefits and alternative therapies were reviewed in detail with the patient.  All questions were answered.  The patient agrees to proceed with angio/intervention.      2. ESRD on dialysis [facility]) Continue HD as scheduled  3. Type 2 diabetes mellitus with complication, unspecified whether long term insulin use (HCC) Continue hypoglycemic medications as already ordered, these medications have been reviewed and there are no changes at this time.  Hgb A1C to be monitored as already arranged by primary service   4. Essential hypertension Continue antihypertensive medications as already ordered, these medications have been reviewed and there are no changes at this time.     Levora Dredge, MD  02/24/2017 1:46 PM

## 2017-02-28 ENCOUNTER — Other Ambulatory Visit (INDEPENDENT_AMBULATORY_CARE_PROVIDER_SITE_OTHER): Payer: Self-pay

## 2017-03-01 ENCOUNTER — Encounter
Admission: RE | Disposition: A | Discharge status: Home / Self Care | Payer: Self-pay | Source: Ambulatory Visit | Attending: Vascular Surgery

## 2017-03-01 ENCOUNTER — Ambulatory Visit
Admission: RE | Admit: 2017-03-01 | Discharge: 2017-03-01 | Disposition: A | Discharge status: Home / Self Care | Payer: Medicare Other | Source: Ambulatory Visit | Attending: Vascular Surgery | Admitting: Vascular Surgery

## 2017-03-01 ENCOUNTER — Encounter
Admission: RE | Admit: 2017-03-01 | Discharge: 2017-03-01 | Disposition: A | Discharge status: Home / Self Care | Payer: Medicare Other | Source: Ambulatory Visit | Attending: Vascular Surgery | Admitting: Vascular Surgery

## 2017-03-01 DIAGNOSIS — Z9889 Other specified postprocedural states: Secondary | ICD-10-CM | POA: Diagnosis not present

## 2017-03-01 DIAGNOSIS — Z886 Allergy status to analgesic agent status: Secondary | ICD-10-CM | POA: Insufficient documentation

## 2017-03-01 DIAGNOSIS — Z992 Dependence on renal dialysis: Secondary | ICD-10-CM | POA: Insufficient documentation

## 2017-03-01 DIAGNOSIS — Y832 Surgical operation with anastomosis, bypass or graft as the cause of abnormal reaction of the patient, or of later complication, without mention of misadventure at the time of the procedure: Secondary | ICD-10-CM | POA: Insufficient documentation

## 2017-03-01 DIAGNOSIS — T82868A Thrombosis of vascular prosthetic devices, implants and grafts, initial encounter: Secondary | ICD-10-CM | POA: Diagnosis not present

## 2017-03-01 DIAGNOSIS — Z880 Allergy status to penicillin: Secondary | ICD-10-CM | POA: Diagnosis not present

## 2017-03-01 DIAGNOSIS — Z87891 Personal history of nicotine dependence: Secondary | ICD-10-CM | POA: Diagnosis not present

## 2017-03-01 DIAGNOSIS — Z882 Allergy status to sulfonamides status: Secondary | ICD-10-CM | POA: Diagnosis not present

## 2017-03-01 DIAGNOSIS — E1122 Type 2 diabetes mellitus with diabetic chronic kidney disease: Secondary | ICD-10-CM | POA: Diagnosis not present

## 2017-03-01 DIAGNOSIS — T82858A Stenosis of vascular prosthetic devices, implants and grafts, initial encounter: Secondary | ICD-10-CM | POA: Diagnosis not present

## 2017-03-01 DIAGNOSIS — Z8673 Personal history of transient ischemic attack (TIA), and cerebral infarction without residual deficits: Secondary | ICD-10-CM | POA: Insufficient documentation

## 2017-03-01 DIAGNOSIS — I120 Hypertensive chronic kidney disease with stage 5 chronic kidney disease or end stage renal disease: Secondary | ICD-10-CM | POA: Diagnosis not present

## 2017-03-01 DIAGNOSIS — N186 End stage renal disease: Secondary | ICD-10-CM

## 2017-03-01 HISTORY — DX: Hypothyroidism, unspecified: E03.9

## 2017-03-01 HISTORY — PX: A/V FISTULAGRAM: CATH118298

## 2017-03-01 HISTORY — DX: Anemia, unspecified: D64.9

## 2017-03-01 HISTORY — DX: Gastro-esophageal reflux disease without esophagitis: K21.9

## 2017-03-01 LAB — POTASSIUM (ARMC VASCULAR LAB ONLY): POTASSIUM (ARMC VASCULAR LAB): 4.2 (ref 3.5–5.1)

## 2017-03-01 LAB — GLUCOSE, CAPILLARY: GLUCOSE-CAPILLARY: 87 mg/dL (ref 65–99)

## 2017-03-01 SURGERY — A/V FISTULAGRAM
Anesthesia: Moderate Sedation | Laterality: Left

## 2017-03-01 MED ORDER — IOPAMIDOL (ISOVUE-300) INJECTION 61%
INTRAVENOUS | Status: DC | PRN
  Administered 2017-03-01: 30 mL via INTRAVENOUS

## 2017-03-01 MED ORDER — HYDRALAZINE HCL 20 MG/ML IJ SOLN
INTRAMUSCULAR | Status: DC | PRN
  Administered 2017-03-01: 10 mg via INTRAVENOUS

## 2017-03-01 MED ORDER — HEPARIN SODIUM (PORCINE) 1000 UNIT/ML IJ SOLN
INTRAMUSCULAR | Status: DC | PRN
  Administered 2017-03-01: 3000 [IU] via INTRAVENOUS

## 2017-03-01 MED ORDER — MIDAZOLAM HCL 2 MG/2ML IJ SOLN
INTRAMUSCULAR | Status: DC | PRN
  Administered 2017-03-01: 2 mg via INTRAVENOUS
  Administered 2017-03-01: 1 mg via INTRAVENOUS

## 2017-03-01 MED ORDER — FENTANYL CITRATE (PF) 100 MCG/2ML IJ SOLN
INTRAMUSCULAR | Status: DC | PRN
  Administered 2017-03-01 (×2): 25 ug via INTRAVENOUS
  Administered 2017-03-01: 50 ug via INTRAVENOUS

## 2017-03-01 MED ORDER — SODIUM CHLORIDE 0.9 % IV SOLN: INTRAVENOUS | Status: DC

## 2017-03-01 MED ORDER — CLINDAMYCIN PHOSPHATE 300 MG/50ML IV SOLN
300.0000 mg | Freq: Once | INTRAVENOUS | Status: AC
  Administered 2017-03-01: 300 mg via INTRAVENOUS

## 2017-03-01 SURGICAL SUPPLY — 18 items
BALLN DORADO 8X20X80 (BALLOONS) ×2
BALLN LUTONIX AV 9X60X75 (BALLOONS) ×2
BALLN LUTONIX DCB 7X60X130 (BALLOONS) ×2
BALLOON DORADO 8X20X80 (BALLOONS) ×1 IMPLANT
BALLOON LUTONIX AV 9X60X75 (BALLOONS) ×1 IMPLANT
BALLOON LUTONIX DCB 7X60X130 (BALLOONS) ×1 IMPLANT
DEVICE PRESTO INFLATION (MISCELLANEOUS) ×2 IMPLANT
DRAPE BRACHIAL (DRAPES) ×2 IMPLANT
GRAFT STENT FLAIR ENDOVAS 9X50 (Permanent Stent) ×2 IMPLANT
GRAFT STENT STR ENDOVAS 8X50 (Permanent Stent) ×2 IMPLANT
NEEDLE ENTRY 21GA 7CM ECHOTIP (NEEDLE) ×2 IMPLANT
PACK ANGIOGRAPHY (CUSTOM PROCEDURE TRAY) ×2 IMPLANT
SET INTRO CAPELLA COAXIAL (SET/KITS/TRAYS/PACK) ×2 IMPLANT
SHEATH BRITE TIP 6FRX5.5 (SHEATH) ×2 IMPLANT
SHEATH BRITE TIP 7FRX5.5 (SHEATH) ×2 IMPLANT
SHIELD RADPAD DADD DRAPE 4X9 (MISCELLANEOUS) ×2 IMPLANT
SUT MNCRL AB 4-0 PS2 18 (SUTURE) ×2 IMPLANT
WIRE MAGIC TORQUE 260C (WIRE) ×2 IMPLANT

## 2017-03-01 NOTE — Progress Notes (Signed)
Patient here to day for fistulogram per Dr Gilda Crease, attached to monitor for procedure and will monitor vitals throughout procedure.

## 2017-03-01 NOTE — Op Note (Signed)
OPERATIVE NOTE   PROCEDURE: 1. Contrast injection left arm brachial axillary AV access 2. Percutaneous transluminal angioplasty and stent placement central segment to 8 mm   PRE-OPERATIVE DIAGNOSIS: Complication of dialysis access                                                       End Stage Renal Disease  POST-OPERATIVE DIAGNOSIS: same as above   SURGEON: Katha Cabal, M.D.  ANESTHESIA: Conscious sedation was administered under my direct supervision by the interventional radiology RN. IV Versed plus fentanyl were utilized. Continuous ECG, pulse oximetry and blood pressure was monitored throughout the entire procedure.  Conscious sedation was for a total of 46 minutes.  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 female who  presents with malfunctioning left arm AV access.  The patient is scheduled for angiography with possible intervention of the AV access.  The patient is aware the risks include but are not limited to: bleeding, infection, thrombosis of the cannulated access, and possible anaphylactic reaction to the contrast.  The patient acknowledges if the access can not be salvaged a tunneled catheter will be needed and will be placed during this procedure.  The patient is aware of the risks of the procedure and elects to proceed with the angiogram and intervention.  DESCRIPTION: After full informed written consent was obtained, the patient was brought back to the Special Procedure suite and placed supine position.  Appropriate cardiopulmonary monitors were placed.  The left arm was prepped and draped in the standard fashion.  Appropriate timeout is called. The left brachial axillary AV graft  was cannulated with a micropuncture needle.  Cannulation  was performed with ultrasound guidance. Ultrasound was placed in a sterile sleeve, the AV access was interrogated and noted to be echolucent and compressible indicating patency. Image was recorded for the permanent record. The puncture is performed under continuous ultrasound visualization.   The microwire was advanced and the needle was exchanged for  a microsheath.  The J-wire was then advanced and a 6 Fr sheath inserted.  Hand injections were completed to image the access from the arterial anastomosis through the entire access.  The central venous structures were also imaged by hand injections.  Based on the images,  3000 units of heparin was given and a wire was negotiated through the strictures of the central stenosis. The sheath was then upsized to a 7 [place] sheath.  An 8 x 60 Lutonix drug-eluting balloon was used.  Inflation was to 14 atm for 1 minute.  Follow-up imaging demonstrated this was undersized therefore 9 x 60 Lutonix drug-eluting balloon was used to angioplasty the central venous stricture. Full expansion of the lesion was not achieved and I therefore elected to stent the lesion. A 9 x 50 flared flair stent was then deployed within the subclavian vein and subsequently the segment between the existing stent and the new flair stent was bridged with a 8 x 50 straight flair stent. Both stents were then postdilated with an 8 mm x 2 cm [place] balloon inflated to 16 atm.  Follow-up imaging demonstrates  significant improvement with a marked reduction of the strictures.  There is now rapid flow of contrast through the graft and the central veins.   A 4-0 Monocryl purse-string suture was sewn around the sheath.  The sheath was removed and light pressure was applied.  A sterile bandage was applied to the puncture site.    COMPLICATIONS: None  CONDITION: [name], M.D  [facility] Office: [phone]  03/01/2017 9:47 AM

## 2017-03-01 NOTE — Progress Notes (Signed)
Dr. Gilda Crease in at bedside to speak with pt. Re: results. Pt. Verbalized understanding.

## 2017-03-01 NOTE — H&P (Signed)
[facility] History & Physical Update  The patient was interviewed and re-examined.  The patient's previous History and Physical has been reviewed and is unchanged.  There is no change in the plan of care. We plan to proceed with the scheduled procedure.  Levora Dredge, MD  03/01/2017, 8:21 AM

## 2017-03-03 ENCOUNTER — Encounter: Payer: Self-pay | Admitting: Vascular Surgery

## 2017-03-21 ENCOUNTER — Other Ambulatory Visit (INDEPENDENT_AMBULATORY_CARE_PROVIDER_SITE_OTHER): Payer: Self-pay | Admitting: Vascular Surgery

## 2017-03-21 DIAGNOSIS — N185 Chronic kidney disease, stage 5: Secondary | ICD-10-CM

## 2017-03-23 ENCOUNTER — Encounter (INDEPENDENT_AMBULATORY_CARE_PROVIDER_SITE_OTHER): Payer: Medicare Other

## 2017-03-23 ENCOUNTER — Encounter (INDEPENDENT_AMBULATORY_CARE_PROVIDER_SITE_OTHER): Payer: Medicare Other | Admitting: Vascular Surgery

## 2017-04-28 ENCOUNTER — Encounter (INDEPENDENT_AMBULATORY_CARE_PROVIDER_SITE_OTHER): Payer: Medicare Other

## 2017-04-28 ENCOUNTER — Ambulatory Visit (INDEPENDENT_AMBULATORY_CARE_PROVIDER_SITE_OTHER): Payer: Medicare Other | Admitting: Vascular Surgery

## 2017-07-12 DEATH — deceased
